# Patient Record
Sex: Male | Born: 1975 | Race: White | Hispanic: No | Marital: Married | Smoking: Former smoker
Health system: Southern US, Community
[De-identification: ages and names within clinical notes are randomized; demographics above are authoritative.]

## PROBLEM LIST (undated history)

## (undated) DIAGNOSIS — J309 Allergic rhinitis, unspecified: Secondary | ICD-10-CM

## (undated) DIAGNOSIS — E785 Hyperlipidemia, unspecified: Secondary | ICD-10-CM

## (undated) HISTORY — PX: APPENDECTOMY: SHX54

## (undated) HISTORY — DX: Hyperlipidemia, unspecified: E78.5

## (undated) HISTORY — DX: Allergic rhinitis, unspecified: J30.9

---

## 2008-01-02 ENCOUNTER — Ambulatory Visit: Payer: Self-pay | Admitting: Internal Medicine

## 2010-06-04 ENCOUNTER — Ambulatory Visit: Payer: Self-pay | Admitting: Internal Medicine

## 2010-06-04 DIAGNOSIS — J069 Acute upper respiratory infection, unspecified: Secondary | ICD-10-CM | POA: Insufficient documentation

## 2010-06-17 ENCOUNTER — Telehealth: Payer: Self-pay | Admitting: Internal Medicine

## 2010-10-06 NOTE — Assessment & Plan Note (Signed)
Summary: COUGH, CONGESTION // RS   Vital Signs:  Chan profile:   35 year old male Weight:      186 pounds Temp:     98.3 degrees F oral BP sitting:   110 / 72  (right arm) Cuff size:   regular  Vitals Entered By: Duard Brady LPN (June 04, 2010 4:06 PM) dCC: c/o cough, congestion , otc not heling  Is Chan Diabetic? No   CC:  c/o cough, congestion , and otc not heling .  History of Present Illness: Jason Chan who presents with a one-week history of cough and congestion.  He has had no fever, chills, chest pain or shortness of breath.  Cough is minimally productive.  He has a daughter and wife, who have had a similar acute illness.  He has a daughter in daycare, who first became ill.  Allergies (verified): No Known Drug Allergies he Review of Systems       The Chan complains of anorexia and prolonged cough.  The Chan denies fever, weight loss, weight gain, vision loss, decreased hearing, hoarseness, chest pain, syncope, dyspnea on exertion, peripheral edema, headaches, hemoptysis, abdominal pain, melena, hematochezia, severe indigestion/heartburn, hematuria, incontinence, genital sores, muscle weakness, suspicious skin lesions, transient blindness, difficulty walking, depression, unusual weight change, abnormal bleeding, enlarged lymph nodes, angioedema, breast masses, and testicular masses.    Physical Exam  General:  Well-developed,well-nourished,in no acute distress; alert,appropriate and cooperative throughout examination Head:  Normocephalic and atraumatic without obvious abnormalities. No apparent alopecia or balding. Eyes:  No corneal or conjunctival inflammation noted. EOMI. Perrla. Funduscopic exam benign, without hemorrhages, exudates or papilledema. Vision grossly normal. Ears:  External ear exam shows no significant lesions or deformities.  Otoscopic examination reveals clear canals, tympanic membranes are intact bilaterally without bulging,  retraction, inflammation or discharge. Hearing is grossly normal bilaterally. Nose:  External nasal examination shows no deformity or inflammation. Nasal mucosa are pink and moist without lesions or exudates. Mouth:  pharyngeal erythema.  pharyngeal erythema.   Neck:  No deformities, masses, or tenderness noted. Lungs:  Normal respiratory effort, chest expands symmetrically. Lungs are clear to auscultation, no crackles or wheezes. Heart:  Normal rate and regular rhythm. S1 and S2 normal without gallop, murmur, click, rub or other extra sounds.   Impression & Recommendations:  Problem # 1:  URI (ICD-465.9)  His updated medication list for this problem includes:    Hydrocodone-homatropine 5-1.5 Mg/25ml Syrp (Hydrocodone-homatropine) .Marland Kitchen... 1 teaspoon every 6 hours as needed for cough  His updated medication list for this problem includes:    Hydrocodone-homatropine 5-1.5 Mg/56ml Syrp (Hydrocodone-homatropine) .Marland Kitchen... 1 teaspoon every 6 hours as needed for cough  Complete Medication List: 1)  Hydrocodone-homatropine 5-1.5 Mg/2ml Syrp (Hydrocodone-homatropine) .Marland Kitchen.. 1 teaspoon every 6 hours as needed for cough  Chan Instructions: 1)  Get plenty of rest, drink lots of clear liquids, and use Tylenol or Ibuprofen for fever and comfort. Return in 7-10 days if you're not better:sooner if you're feeling worse. 2)  VIMOVO ONE TWICE DAILY 3)  CLARINEX ONE DAILY  Prescriptions: HYDROCODONE-HOMATROPINE 5-1.5 MG/5ML SYRP (HYDROCODONE-HOMATROPINE) 1 teaspoon every 6 hours as needed for cough  #6 OZ x 0   Entered and Authorized by:   Gordy Savers  MD   Signed by:   Gordy Savers  MD on 06/04/2010   Method used:   Print then Give to Chan   RxID:   [id number]

## 2010-10-06 NOTE — Progress Notes (Signed)
Summary: new RX  Phone Note Call from Patient   Caller: Patient Call For: Gordy Savers  MD Summary of Call: Pt is still coughing and congested and wants new RX.   Samples did not work from the office.   [phone] [facility] [address]) Initial call taken by: Lynann Beaver CMA,  June 17, 2010 11:42 AM  Follow-up for Phone Call        Tussionex (, generic, if available) 4 oz 1 teaspoon every  12 hours as needed for cough Follow-up by: Gordy Savers  MD,  June 17, 2010 12:43 PM    New/Updated Medications: Sandria Senter ER 10-8 MG/5ML LQCR (HYDROCOD POLST-CHLORPHEN POLST) as directed Prescriptions: Sandria Senter ER 10-8 MG/5ML LQCR (HYDROCOD POLST-CHLORPHEN POLST) as directed  #4 oz x 0   Entered by:   Lynann Beaver CMA   Authorized by:   Gordy Savers  MD   Signed by:   Lynann Beaver CMA on 06/17/2010   Method used:   Telephoned to ...       [facility] [address] (retail)       [address]       [place], [place]  [address]       Ph: [phone] or [phone]       Fax: [phone]   RxID:   [id number]

## 2014-11-18 ENCOUNTER — Telehealth: Payer: Self-pay | Admitting: Internal Medicine

## 2014-11-18 NOTE — Telephone Encounter (Signed)
Okay, only if necessary.  Suggest referral to another provider who is accepting new patients

## 2014-11-18 NOTE — Telephone Encounter (Signed)
Pt would like to know if you will accept him back as a pt. Pt not seen in many yrs. Pt had blood in his stool and wants to be seen soon for that.

## 2014-11-19 NOTE — Telephone Encounter (Signed)
Scheduled pt for new acute w/ dr hunter and then new pt visit to re-est w/ him.  Thank you dr Kirtland Bouchardk!!

## 2014-11-20 ENCOUNTER — Encounter: Payer: Self-pay | Admitting: Family Medicine

## 2014-11-20 ENCOUNTER — Ambulatory Visit (INDEPENDENT_AMBULATORY_CARE_PROVIDER_SITE_OTHER): Payer: 59 | Admitting: Family Medicine

## 2014-11-20 VITALS — BP 120/78 | HR 59 | Temp 98.1°F | Wt 189.0 lb

## 2014-11-20 DIAGNOSIS — E785 Hyperlipidemia, unspecified: Secondary | ICD-10-CM | POA: Insufficient documentation

## 2014-11-20 DIAGNOSIS — K625 Hemorrhage of anus and rectum: Secondary | ICD-10-CM

## 2014-11-20 DIAGNOSIS — K921 Melena: Secondary | ICD-10-CM

## 2014-11-20 LAB — LIPID PANEL
CHOL/HDL RATIO: 4
Cholesterol: 258 mg/dL — ABNORMAL HIGH (ref 0–200)
HDL: 65.2 mg/dL (ref >39.00)
LDL CALC: 177 mg/dL — AB (ref 0–99)
NonHDL: 192.8
TRIGLYCERIDES: 77 mg/dL (ref 0.0–149.0)
VLDL: 15.4 mg/dL (ref 0.0–40.0)

## 2014-11-20 LAB — COMPREHENSIVE METABOLIC PANEL
ALK PHOS: 69 U/L (ref 39–117)
ALT: 11 U/L (ref 0–53)
AST: 18 U/L (ref 0–37)
Albumin: 4.9 g/dL (ref 3.5–5.2)
BUN: 17 mg/dL (ref 6–23)
CO2: 30 mEq/L (ref 19–32)
Calcium: 9.8 mg/dL (ref 8.4–10.5)
Chloride: 100 mEq/L (ref 96–112)
Creatinine, Ser: 1.18 mg/dL (ref 0.40–1.50)
GFR: 72.32 mL/min (ref >60.00)
Glucose, Bld: 93 mg/dL (ref 70–99)
Potassium: 5.2 mEq/L — ABNORMAL HIGH (ref 3.5–5.1)
SODIUM: 138 meq/L (ref 135–145)
Total Bilirubin: 0.7 mg/dL (ref 0.2–1.2)
Total Protein: 7.9 g/dL (ref 6.0–8.3)

## 2014-11-20 LAB — CBC
HCT: 45.9 % (ref 39.0–52.0)
Hemoglobin: 15.9 g/dL (ref 13.0–17.0)
MCHC: 34.7 g/dL (ref 30.0–36.0)
MCV: 92.5 fl (ref 78.0–100.0)
Platelets: 277 10*3/uL (ref 150.0–400.0)
RBC: 4.96 Mil/uL (ref 4.22–5.81)
RDW: 13.2 % (ref 11.5–15.5)
WBC: 5.4 10*3/uL (ref 4.0–10.5)

## 2014-11-20 LAB — TSH: TSH: 1.17 u[IU]/mL (ref 0.35–4.50)

## 2014-11-20 NOTE — Patient Instructions (Addendum)
No clear blood on your hemoccult test today.   We are going to send you home with stool cards and if you see any of this redness again-want you to check that area for blood and bring the sample back into our office.   Also update fasting labs today. If you are anemic, I will likely go ahead and refer for colonoscopy.   If you have true blood on one of these tests i will also send you for colonoscopy  We can always go ahead and proceed forward with colonoscopy if you prefer. There is some risk for colon polyps that we cannot rule out with these stool cards alone.   If you don't have one of these bowel movements within 3 months, go ahead and complete the stool cards as this is reasonable screening for colon cancer

## 2014-11-20 NOTE — Assessment & Plan Note (Signed)
Check lipids. Would likely use 5% cut off for 10 year risk for statin given strong family history. Hold on aspirin for now-perhaps start age 39?

## 2014-11-20 NOTE — Progress Notes (Signed)
Jason ConchStephen Wilson Sample, MD Phone: [phone]  Subjective:  Patient presents today to establish care. Was seen here over 5 years ago but most recently cared for by NP at work through [employer]. Chief complaint-noted.   Blood in Stool ? -after eating a lot of beets. Wife thought it was blood about a week ago. 2 episodes of bright red material in toilet. It was not clear if this was material from the beets or actual bloodNo blood on toilet tissue. History of hemorrhoids (internal) noted on colonosocopy. Occasionally has had blood with wiping but never blood in the toilet mixed in with stoolso this was different and alarmed him. Has had the intermittent bleeds since around age 39 with wiping. Colonoscopy at age 39. Colonscopy was normal at that time except a hemorrhoid and no follow up since that time.   Maternal grandfather with colon cancer died in his 4150s.   ROS- denies melena, fatigues, shortness of breath, chest pain  Hyperlipidemia-reported from history (family history MI father age 41)  On statin: no Regular exercise: no, advised Diet: reasonable but knows he could watch portions better ROS- no chest pain or shortness of breath. No myalgias  The following were reviewed and entered/updated in epic: Past Medical History  Diagnosis Date  . Allergic rhinitis     otc claritin  . Hyperlipidemia     no rx   Patient Active Problem List   Diagnosis Date Noted  . Blood in stool 11/20/2014    Priority: Medium  . Hyperlipidemia 11/20/2014    Priority: Medium   Past Surgical History  Procedure Laterality Date  . Appendectomy      around 1990    Family History  Problem Relation Age of Onset  . Arthritis Mother   . Breast cancer Mother   . Prostate cancer Father     age 10060  . Hyperlipidemia Mother   . Hyperlipidemia Father   . Hypertension Father   . Heart attack Father     age 41    Medications- reviewed and updated. None.   Allergies-reviewed and updated No Known  Allergies  History   Social History  . Marital Status: Married    Spouse Name: N/A  . Number of Children: N/A  . Years of Education: N/A   Social History Main Topics  . Smoking status: Never Smoker   . Smokeless tobacco: Not on file     Comment: uses vaprorizer with nicotine intermittently  . Alcohol Use: 3.0 oz/week    5 Standard drinks or equivalent per week  . Drug Use: No  . Sexual Activity: Not on file   Other Topics Concern  . Not on file   Social History Narrative   Married. 3 kids. (4-9 in 2016)      Claims adjustor      Hobbies: golf, time with kids, xbox    ROS--See HPI , otherwise full ROS was completed and negative except as noted above  Objective: BP 120/78 mmHg  Pulse 59  Temp(Src) 98.1 F (36.7 C)  Wt 189 lb (85.73 kg) Gen: NAD, resting comfortably HEENT: Mucous membranes are moist. Oropharynx normal. TM normal. Eyes: sclera and lids normal, PERRLA Neck: no thyromegaly, no cervical lymphadenopathy CV: RRR no murmurs rubs or gallops Lungs: CTAB no crackles, wheeze, rhonchi Abdomen: soft/nontender/nondistended/normal bowel sounds. No rebound or guarding.  Rectal: normal prostate size without any nodules or asymmetry. Hemocult negative.  Ext: no edema, 2+ PT pulses Skin: warm, dry, no rash Neuro: 5/5 strength in  upper and lower extremities, normal gait, normal reflexes   Anoscope deferred  Assessment/Plan:  Blood in stool History of internal hemorrhoids on colonoscopy around age 2. Family history of colon cancer in maternal grandfather who died in his 72s. No polyps at age 71. Current "bleeding" is not clear if it was true blood or related to beets he was eating. Hemocult was negative in office. I gave him home stool cards if he sees this red material again and he is to collect samples even if does not see red material again (and bring in about 3 months from now). If follow up heme + or if hgb low today, would refer for colonoscopy. Otherwise, may  wait until 38 but consider stool cards yearly until that time.    Hyperlipidemia Check lipids. Would likely use 5% cut off for 10 year risk for statin given strong family history. Hold on aspirin for now-perhaps start age 64?   Return precautions advised. Patient may cancel June appointment but needs to bring stool cards regardless in 3 months.   Orders Placed This Encounter  Procedures  . CBC    [place]  . Comprehensive metabolic panel    [place]    Order Specific Question:  Has the patient fasted?    Answer:  No  . Lipid panel    [place]    Order Specific Question:  Has the patient fasted?    Answer:  No  . TSH    [place]

## 2014-11-20 NOTE — Assessment & Plan Note (Signed)
History of internal hemorrhoids on colonoscopy around age 39. Family history of colon cancer in maternal grandfather who died in his 5350s. No polyps at age 39. Current "bleeding" is not clear if it was true blood or related to beets he was eating. Hemocult was negative in office. I gave him home stool cards if he sees this red material again and he is to collect samples even if does not see red material again (and bring in about 3 months from now). If follow up heme + or if hgb low today, would refer for colonoscopy. Otherwise, may wait until 38 but consider stool cards yearly until that time.

## 2014-11-22 ENCOUNTER — Telehealth: Payer: Self-pay | Admitting: Family Medicine

## 2014-11-22 MED ORDER — ATORVASTATIN CALCIUM 20 MG PO TABS: 20.0000 mg | ORAL_TABLET | Freq: Every day | ORAL | Status: DC

## 2014-11-22 NOTE — Telephone Encounter (Addendum)
Pt was seen on 3-16 and now would like to get on medication the md discuss with him ?[facility]. Please call pt once rx has been sent

## 2014-11-22 NOTE — Telephone Encounter (Signed)
Pt.notified

## 2015-01-09 ENCOUNTER — Telehealth: Payer: Self-pay | Admitting: Family Medicine

## 2015-01-09 NOTE — Telephone Encounter (Signed)
noted 

## 2015-01-09 NOTE — Telephone Encounter (Signed)
Patient Name: Jason BabinskiUL Bisher DOB: 1976/08/20 Initial Comment Caller states RX meds generic for lipitor; getting low BS couple of times per day; getting the shakes; thinks could be from med; never been dx as diabetic; had them from time to time but not as frequently; NO SX NOW. Nurse Assessment Nurse: Yetta BarreJones, RN, Miranda Date/Time (Eastern Time): 01/09/2015 11:08:43 AM Confirm and document reason for call. If symptomatic, describe symptoms. ---Caller states he has episodes of feeling shaky and tired a couple of times a day for the last week. He started on Lipitor 1-2 months ago and wondering if this could be related. Has the patient traveled out of the country within the last 30 days? ---Not Applicable Does the patient require triage? ---Yes Related visit to physician within the last 2 weeks? ---No Does the PT have any chronic conditions? (i.e. diabetes, asthma, etc.) ---Yes List chronic conditions. ---High Cholesterol Guidelines Guideline Title Affirmed Question Affirmed Notes Weakness (Generalized) and Fatigue Taking a medicine that could cause weakness (e.g., blood pressure medications, diuretics) Final Disposition User See Physician within 24 Hours Yetta BarreJones, RN, Miranda Comments Appt scheduled for 4pm tomorrow with Dr. Selena BattenKim.

## 2015-01-09 NOTE — Telephone Encounter (Signed)
Pt is having side effect from generic lipitor. Pt feels like he is getting low blood sugar at least twice a day. Pt call transfer to triage

## 2015-01-10 ENCOUNTER — Encounter: Payer: Self-pay | Admitting: Family Medicine

## 2015-01-10 ENCOUNTER — Ambulatory Visit (INDEPENDENT_AMBULATORY_CARE_PROVIDER_SITE_OTHER): Payer: 59 | Admitting: Family Medicine

## 2015-01-10 VITALS — BP 102/60 | HR 75 | Temp 98.1°F | Ht 71.0 in | Wt 185.2 lb

## 2015-01-10 DIAGNOSIS — R5383 Other fatigue: Secondary | ICD-10-CM

## 2015-01-10 DIAGNOSIS — T50905A Adverse effect of unspecified drugs, medicaments and biological substances, initial encounter: Secondary | ICD-10-CM

## 2015-01-10 DIAGNOSIS — T887XXA Unspecified adverse effect of drug or medicament, initial encounter: Secondary | ICD-10-CM | POA: Diagnosis not present

## 2015-01-10 LAB — HEMOGLOBIN A1C
Hgb A1c MFr Bld: 5.6 % (ref <5.7)
Mean Plasma Glucose: 114 mg/dL (ref <117)

## 2015-01-10 NOTE — Addendum Note (Signed)
Addended by: Alfred LevinsWYRICK, CINDY D on: 01/10/2015 04:18 PM   Modules accepted: Orders

## 2015-01-10 NOTE — Progress Notes (Signed)
Pre visit review using our clinic review tool, if applicable. No additional management support is needed unless otherwise documented below in the visit note. 

## 2015-01-10 NOTE — Progress Notes (Signed)
HPI:  Question medication reaction: -started statin 1-2 months ago -hungry/mildly fatigued feeling lasting about 5 minutes has occurred several times this week a few hours after a meal - feels fine once eats something -he reports sometimes has this anyways his whole life but only occurs once per week  -reports someone at work told him this is a side effect to the medication he is taking and can be diabetes -no symptoms today -denies: fevers, chills, neuropathy, CP, SOB, weakness or fatigue other times, leg cramps, polyuria or polydipsia -he is intentionally trying to loose weight  ROS: See pertinent positives and negatives per HPI.  Past Medical History  Diagnosis Date  . Allergic rhinitis     otc claritin  . Hyperlipidemia     no rx    Past Surgical History  Procedure Laterality Date  . Appendectomy      around 1990    Family History  Problem Relation Age of Onset  . Arthritis Mother   . Breast cancer Mother   . Prostate cancer Father     age 41  . Hyperlipidemia Mother   . Hyperlipidemia Father   . Hypertension Father   . Heart attack Father     age 41    History   Social History  . Marital Status: Married    Spouse Name: N/A  . Number of Children: N/A  . Years of Education: N/A   Social History Main Topics  . Smoking status: Never Smoker   . Smokeless tobacco: Not on file     Comment: uses vaprorizer with nicotine intermittently  . Alcohol Use: 3.0 oz/week    5 Standard drinks or equivalent per week  . Drug Use: No  . Sexual Activity: Not on file   Other Topics Concern  . None   Social History Narrative   Married. 3 kids. (4-9 in 2016)      Claims adjustor      Hobbies: golf, time with kids, xbox     Current outpatient prescriptions:  .  atorvastatin (LIPITOR) 20 MG tablet, Take 1 tablet (20 mg total) by mouth daily., Disp: 30 tablet, Rfl: 11 .  fluticasone (FLONASE) 50 MCG/ACT nasal spray, Place into both nostrils daily., Disp: , Rfl:    EXAM:  Filed Vitals:   01/10/15 1543  BP: 102/60  Pulse: 75  Temp: 98.1 F (36.7 C)    Body mass index is 25.84 kg/(m^2).  GENERAL: vitals reviewed and listed above, alert, oriented, appears well hydrated and in no acute distress  HEENT: atraumatic, conjunttiva clear, no obvious abnormalities on inspection of external nose and ears  NECK: no obvious masses on inspection  LUNGS: clear to auscultation bilaterally, no wheezes, rales or rhonchi, good air movement  CV: HRRR, no peripheral edema  MS: moves all extremities without noticeable abnormality  PSYCH: pleasant and cooperative, no obvious depression or anxiety  ASSESSMENT AND PLAN:  Discussed the following assessment and plan:  Medication side effect, initial encounter  Other fatigue - Plan: Hemoglobin A1c  -his symptoms seem quite mild and has not had any today -no alarm symptoms -he is worried about a side effect to the statin - we discussed stopping, changing, risks/benefits -he opted to check hgba1c to see if prediabetic, cont statin for now and follow up with PCP in 4 weeks to discuss further if symptoms persist -Patient advised to return or notify a doctor immediately if symptoms worsen or persist or new concerns arise.  Patient Instructions  BEFORE YOU  LEAVE: -hgba1c -follow up in 4 weeks (sooner if needed)     Omolara Carol, Dahlia ClientHANNAH R.

## 2015-01-10 NOTE — Patient Instructions (Signed)
BEFORE YOU LEAVE: -hgba1c -follow up in 4 weeks (sooner if needed)

## 2015-02-06 ENCOUNTER — Ambulatory Visit: Payer: Self-pay | Admitting: Family Medicine

## 2015-10-21 ENCOUNTER — Other Ambulatory Visit: Payer: Self-pay | Admitting: Family Medicine

## 2015-11-06 ENCOUNTER — Telehealth: Payer: Self-pay | Admitting: Family Medicine

## 2015-11-06 NOTE — Telephone Encounter (Signed)
You can schedule Direct LDL, total cholesterol, triglycerides and CMP under hyperlipidemia. Tell him last full lipid panel was 11/20/14 so insurance will not pay for full panel

## 2015-11-06 NOTE — Telephone Encounter (Signed)
Pt states he has seen you before, last June for an acute visit. Pt has been on atorvastatin (LIPITOR) 20 MG tablet  since that visit and was hoping to get his cholesterol checked prior to his visit   with you on Monday, March 6.  However, the was put in as a new pt visit. Is it ok to schedule   some labs before then? Pt states he has not had it checked since he saw you last March 2016

## 2015-11-06 NOTE — Telephone Encounter (Signed)
Do want any other labs along with lipid?

## 2015-11-07 ENCOUNTER — Other Ambulatory Visit: Payer: Self-pay | Admitting: Family Medicine

## 2015-11-07 DIAGNOSIS — E785 Hyperlipidemia, unspecified: Secondary | ICD-10-CM

## 2015-11-07 NOTE — Telephone Encounter (Signed)
Labs ordered, please schedule pt for lab visit.

## 2015-11-07 NOTE — Telephone Encounter (Signed)
Pt has been scheduled.  °

## 2015-11-10 ENCOUNTER — Ambulatory Visit: Payer: 59 | Admitting: Family Medicine

## 2015-11-20 ENCOUNTER — Other Ambulatory Visit: Payer: Self-pay | Admitting: Family Medicine

## 2015-11-21 ENCOUNTER — Ambulatory Visit (INDEPENDENT_AMBULATORY_CARE_PROVIDER_SITE_OTHER): Payer: 59 | Admitting: Family Medicine

## 2015-11-21 ENCOUNTER — Encounter: Payer: Self-pay | Admitting: Family Medicine

## 2015-11-21 VITALS — BP 118/80 | HR 70 | Temp 98.2°F | Resp 20 | Ht 71.0 in | Wt 192.0 lb

## 2015-11-21 DIAGNOSIS — Z23 Encounter for immunization: Secondary | ICD-10-CM

## 2015-11-21 DIAGNOSIS — K921 Melena: Secondary | ICD-10-CM

## 2015-11-21 DIAGNOSIS — E785 Hyperlipidemia, unspecified: Secondary | ICD-10-CM | POA: Diagnosis not present

## 2015-11-21 DIAGNOSIS — Z0000 Encounter for general adult medical examination without abnormal findings: Secondary | ICD-10-CM

## 2015-11-21 LAB — LIPID PANEL
CHOLESTEROL: 170 mg/dL (ref 0–200)
HDL: 66.2 mg/dL (ref >39.00)
LDL Cholesterol: 90 mg/dL (ref 0–99)
NonHDL: 103.65
TRIGLYCERIDES: 69 mg/dL (ref 0.0–149.0)
Total CHOL/HDL Ratio: 3
VLDL: 13.8 mg/dL (ref 0.0–40.0)

## 2015-11-21 LAB — POC URINALSYSI DIPSTICK (AUTOMATED)
Bilirubin, UA: NEGATIVE
Blood, UA: NEGATIVE
GLUCOSE UA: NEGATIVE
Ketones, UA: NEGATIVE
Leukocytes, UA: NEGATIVE
NITRITE UA: NEGATIVE
PROTEIN UA: NEGATIVE
Spec Grav, UA: 1.025
UROBILINOGEN UA: 0.2
pH, UA: 6

## 2015-11-21 LAB — COMPREHENSIVE METABOLIC PANEL
ALBUMIN: 4.6 g/dL (ref 3.5–5.2)
ALT: 17 U/L (ref 0–53)
AST: 16 U/L (ref 0–37)
Alkaline Phosphatase: 63 U/L (ref 39–117)
BILIRUBIN TOTAL: 0.9 mg/dL (ref 0.2–1.2)
BUN: 22 mg/dL (ref 6–23)
CALCIUM: 9.6 mg/dL (ref 8.4–10.5)
CO2: 31 mEq/L (ref 19–32)
CREATININE: 1.03 mg/dL (ref 0.40–1.50)
Chloride: 100 mEq/L (ref 96–112)
GFR: 84.19 mL/min (ref >60.00)
Glucose, Bld: 87 mg/dL (ref 70–99)
Potassium: 3.9 mEq/L (ref 3.5–5.1)
Sodium: 139 mEq/L (ref 135–145)
TOTAL PROTEIN: 7.6 g/dL (ref 6.0–8.3)

## 2015-11-21 LAB — CBC WITH DIFFERENTIAL/PLATELET
BASOS ABS: 0 10*3/uL (ref 0.0–0.1)
BASOS PCT: 0.6 % (ref 0.0–3.0)
EOS ABS: 0.2 10*3/uL (ref 0.0–0.7)
Eosinophils Relative: 3.7 % (ref 0.0–5.0)
HEMATOCRIT: 45 % (ref 39.0–52.0)
HEMOGLOBIN: 15.6 g/dL (ref 13.0–17.0)
Lymphocytes Relative: 34.7 % (ref 12.0–46.0)
Lymphs Abs: 2.1 10*3/uL (ref 0.7–4.0)
MCHC: 34.7 g/dL (ref 30.0–36.0)
MCV: 92.2 fl (ref 78.0–100.0)
Monocytes Absolute: 0.4 10*3/uL (ref 0.1–1.0)
Monocytes Relative: 7.1 % (ref 3.0–12.0)
Neutro Abs: 3.2 10*3/uL (ref 1.4–7.7)
Neutrophils Relative %: 53.9 % (ref 43.0–77.0)
Platelets: 265 10*3/uL (ref 150.0–400.0)
RBC: 4.88 Mil/uL (ref 4.22–5.81)
RDW: 13.2 % (ref 11.5–15.5)
WBC: 5.9 10*3/uL (ref 4.0–10.5)

## 2015-11-21 NOTE — Addendum Note (Signed)
Addended by: Jimmye NormanPHANOS, Loyed Wilmes J on: 11/21/2015 10:34 AM   Modules accepted: Orders

## 2015-11-21 NOTE — Assessment & Plan Note (Signed)
Was likely beets related- resolved without heavy beats. Family history in grandfather though so stool cards until age 42.

## 2015-11-21 NOTE — Progress Notes (Signed)
Pre visit review using our clinic review tool, if applicable. No additional management support is needed unless otherwise documented below in the visit note. 

## 2015-11-21 NOTE — Assessment & Plan Note (Signed)
Father MI in 2050s. Atorvastatin 20mg  since last ldl 177- recheck today. Consider aspirin age 40.

## 2015-11-21 NOTE — Progress Notes (Addendum)
Tana Conch, MD Phone: [phone]  Subjective:  Patient presents today for their annual physical. Chief complaint-noted.   See problem oriented charting- ROS- full  review of systems was completed and negative including in last year has not noted blood in stool as long as not eating beats  The following were reviewed and entered/updated in epic: Past Medical History  Diagnosis Date  . Allergic rhinitis     otc claritin  . Hyperlipidemia    Patient Active Problem List   Diagnosis Date Noted  . Blood in stool 11/20/2014    Priority: Medium  . Hyperlipidemia 11/20/2014    Priority: Medium   Past Surgical History  Procedure Laterality Date  . Appendectomy      around 1990    Family History  Problem Relation Age of Onset  . Arthritis Mother   . Breast cancer Mother   . Cancer Father     age 36 ? kind- checking if prostate cancer  . Hyperlipidemia Father   . Hypertension Father   . Heart attack Father     age 3    Medications- reviewed and updated Current Outpatient Prescriptions  Medication Sig Dispense Refill  . atorvastatin (LIPITOR) 20 MG tablet TAKE 1 TABLET(20 MG) BY MOUTH DAILY 90 tablet 2   No current facility-administered medications for this visit.    Allergies-reviewed and updated No Known Allergies  Social History   Social History  . Marital Status: Married    Spouse Name: N/A  . Number of Children: N/A  . Years of Education: N/A   Social History Main Topics  . Smoking status: Never Smoker   . Smokeless tobacco: None     Comment: uses vaprorizer with nicotine intermittently  . Alcohol Use: 3.0 oz/week    5 Standard drinks or equivalent per week  . Drug Use: No  . Sexual Activity: Not Asked   Other Topics Concern  . None   Social History Narrative   Married. 3 kids. (5-10 in 2017)      Claims adjustor      Hobbies: golf, time with kids, xbox    ROS--See HPI   Objective: BP 118/80 mmHg  Pulse 70  Temp(Src) 98.2 F (36.8  C) (Oral)  Resp 20  Ht  (1.803 m)  Wt 192 lb (87.091 kg)  BMI 26.79 kg/m2  SpO2 98% Gen: NAD, resting comfortably HEENT: Mucous membranes are moist. Oropharynx normal Neck: no thyromegaly CV: RRR no murmurs rubs or gallops Lungs: CTAB no crackles, wheeze, rhonchi Abdomen: soft/nontender/nondistended/normal bowel sounds. No rebound or guarding.  Ext: no edema Skin: warm, dry Neuro: grossly normal, moves all extremities, PERRLA  Assessment/Plan:  40 y.o. male presenting for annual physical.  Health Maintenance counseling: 1. Anticipatory guidance: Patient counseled regarding regular dental exams, eye exams (reading glasses), wearing seatbelts.  2. Risk factor reduction:  Advised patient of need for regular exercise (lacking right now) and diet rich and fruits and vegetables to reduce risk of heart attack and stroke.  Wt Readings from Last 3 Encounters:  11/21/15 192 lb (87.091 kg)  01/10/15 185 lb 3.2 oz (84.006 kg)  11/20/14 189 lb (85.73 kg)  3. Immunizations/screenings/ancillary studies Health Maintenance Due  Topic Date Due  . TETANUS/TDAP - today 12/02/1992  . INFLUENZA VACCINE - [facility]/07/2015   4. Prostate cancer screening- patient to check- he is not sure if it was really prostate cancer in father at age 42- we will decide next year whether  to screen early  5. Colon cancer screening - will do stool cards until age 40  Hyperlipidemia- compliant with atorvastatin, update lipids  Return in about 1 year (around 11/20/2016) for physical. Return precautions advised.   Orders Placed This Encounter  Procedures  . CBC with Differential/Platelet  . Comprehensive metabolic panel    [place]    Order Specific Question:  Has the patient fasted?    Answer:  No  . Lipid panel    [place]    Order Specific Question:  Has the patient fasted?    Answer:  No  . POCT Urinalysis Dipstick (Automated)  . POC Hemoccult Bld/Stl (3-Cd Home Screen)     Send home    Standing Status: Future     Number of Occurrences:      Standing Expiration Date: 11/20/2016   Lupita LeashDonna to enter Tdap, patient had flu as well to be abstracted

## 2015-11-21 NOTE — Patient Instructions (Addendum)
Lupita LeashDonna will update flu shot from November 2016  Tdap today  Labs before you go. Try to watch the weight creep (talk to your wife!) Wt Readings from Last 3 Encounters:  11/21/15 192 lb (87.091 kg)  01/10/15 185 lb 3.2 oz (84.006 kg)  11/20/14 189 lb (85.73 kg)   Consider aspirin 81mg 

## 2016-05-12 ENCOUNTER — Other Ambulatory Visit: Payer: Self-pay | Admitting: Family Medicine

## 2016-05-12 NOTE — Telephone Encounter (Signed)
Rx refill sent to pharmacy. 

## 2016-05-28 ENCOUNTER — Encounter: Payer: Self-pay | Admitting: Internal Medicine

## 2016-05-28 ENCOUNTER — Telehealth: Payer: Self-pay | Admitting: Internal Medicine

## 2016-05-28 MED ORDER — MEBENDAZOLE 100 MG PO CHEW: CHEWABLE_TABLET | ORAL | 0 refills | Status: DC

## 2016-05-28 NOTE — Telephone Encounter (Signed)
Sent in mebendazole. Discussed with patient.

## 2016-05-28 NOTE — Telephone Encounter (Signed)
Comments  User: Oralia ManisMelissa, Mullins, RN Date/Time (Eastern Time): 05/28/2016 4:32:09 PM  Caller reports had a previous treatment OTC for herself and daughter but now has seen live pinworms in all stools for three children, spouse and herself. Requesting a prescription be called into the pharmacy. Caller was placed on hold to consult with the office backline and the office person reports will not call in anything without first being seen, offered to schedule appt with the [facility] office for tomorrow morning, caller reports she will call the pediatrician office back and see if they can call in RX for her and husband, since they are calling in RX for three children. caller reports will call back if still needs to be seen.   Referrals  [facility]

## 2016-05-28 NOTE — Telephone Encounter (Signed)
Jason LawlessCeleste Chan [birth year] Pt of dr. Fabian SharpPanosh. [facility] [place]

## 2016-05-28 NOTE — Telephone Encounter (Signed)
[facility] - Client  TELEPHONE ADVICE RECORD   [facility]     Patient Name: Jason BabinskiPAUL Chan Client [facility] - Client    Client Site [facility]    Physician AA - PHYSICIAN, UNKNOWN- MD    Contact Type Call    Who Is Calling Patient / Member / Family / Caregiver    Call Type Triage / Clinical    Caller Name Sheran LawlessCeleste Baumgardner     Relationship To Patient Spouse    Return Phone Number [phone] (Primary)  Gender: Male Chief Complaint Worms  DOB: 06-08-1976  Reason for Call Symptomatic / Request for Health Information  Age: [age] Y 5 M 24 D Initial Comment CHART 2/2 Caller states herself and her husband may have worms in their bowel movements. She does not know husband's doctor's name.   Return Phone Number: [phone] (Primary) PreDisposition Call Doctor  Address: [address]  Translation No  City[place]State[place]: [place] [id number] No Triage Reason Other    Nurse Assessment  Nurse: Sabino GasserMullins, RN, Melissa Date/Time Lamount Cohen(Eastern Time): 05/28/2016 4:21:31 PM  Confirm and document reason for call. If symptomatic, describe symptoms. You must click the next button to save text entered. ---Caller states herself and her husband may have worms in their bowel movements. She does not know husband's doctor's name.  Has the patient traveled out of the country within the last 30 days? ---Not Applicable  Does the patient have any new or worsening symptoms? ---Yes  Will a triage be completed? ---Yes  Related visit to physician within the last 2 weeks? ---No  Does the PT have any chronic conditions? (i.e. diabetes, asthma, etc.) ---No  Is this a behavioral health or substance abuse call? ---No    Guidelines      Guideline Title Affirmed Question Affirmed Notes Nurse Date/Time Lamount Cohen(Eastern Time)  Rectal Symptoms MODERATE-SEVERE rectal itching (i.e., interferes with school, work, or sleep)  Sabino GasserMullins, RN, Efraim KaufmannMelissa  05/28/2016 4:22:08 PM  Disp. Time Lamount Cohen(Eastern Time) Disposition Final User         05/28/2016 4:22:40 PM See Physician within 24 Hours Yes Sabino GasserMullins, RN, Efraim KaufmannMelissa         Caller Understands: Yes   Disagree/Comply: Disagree   Disagree/Comply Reason: Disagree with instructions   Care Advice Given Per Guideline         SEE PHYSICIAN WITHIN 24 HOURS: CALL BACK IF:             Referrals   [facility]

## 2016-05-31 ENCOUNTER — Telehealth: Payer: Self-pay

## 2016-05-31 NOTE — Telephone Encounter (Signed)
PA Approved. Form faxed back to pharmacy.  

## 2016-05-31 NOTE — Telephone Encounter (Signed)
Received PA request from [facility] for Emverm 100mg  tablet. PA submitted & is pending. Key: [id number]

## 2016-12-01 ENCOUNTER — Other Ambulatory Visit: Payer: Self-pay | Admitting: Family Medicine

## 2017-03-17 DIAGNOSIS — J0100 Acute maxillary sinusitis, unspecified: Secondary | ICD-10-CM | POA: Diagnosis not present

## 2017-05-03 LAB — BASIC METABOLIC PANEL
BUN: 16 (ref 4–21)
Creatinine: 0.9 (ref 0.6–1.3)
Glucose: 96
Potassium: 4.5 (ref 3.4–5.3)
SODIUM: 136 — AB (ref 137–147)

## 2017-05-03 LAB — LIPID PANEL
CHOLESTEROL: 161 (ref 0–200)
HDL: 60 (ref 35–70)
LDL CALC: 84
TRIGLYCERIDES: 83 (ref 40–160)

## 2017-05-03 LAB — HEPATIC FUNCTION PANEL
ALK PHOS: 75 (ref 25–125)
ALT: 19 (ref 10–40)
AST: 20 (ref 14–40)
BILIRUBIN, TOTAL: 0.6

## 2017-05-03 LAB — CBC AND DIFFERENTIAL
HEMATOCRIT: 43 (ref 41–53)
HEMOGLOBIN: 14.9 (ref 13.5–17.5)
Platelets: 270 (ref 150–399)

## 2017-05-05 ENCOUNTER — Encounter: Payer: Self-pay | Admitting: Family Medicine

## 2017-05-28 ENCOUNTER — Other Ambulatory Visit: Payer: Self-pay | Admitting: Family Medicine

## 2017-05-30 ENCOUNTER — Other Ambulatory Visit: Payer: Self-pay | Admitting: Family Medicine

## 2017-07-13 DIAGNOSIS — H60391 Other infective otitis externa, right ear: Secondary | ICD-10-CM | POA: Diagnosis not present

## 2017-08-25 ENCOUNTER — Encounter: Payer: Self-pay | Admitting: Family Medicine

## 2017-08-25 ENCOUNTER — Other Ambulatory Visit: Payer: Self-pay | Admitting: Family Medicine

## 2017-10-18 DIAGNOSIS — E162 Hypoglycemia, unspecified: Secondary | ICD-10-CM | POA: Diagnosis not present

## 2017-10-21 DIAGNOSIS — E162 Hypoglycemia, unspecified: Secondary | ICD-10-CM | POA: Diagnosis not present

## 2017-12-05 ENCOUNTER — Encounter: Payer: 59 | Admitting: Family Medicine

## 2017-12-26 DIAGNOSIS — M79646 Pain in unspecified finger(s): Secondary | ICD-10-CM | POA: Diagnosis not present

## 2017-12-26 DIAGNOSIS — M79642 Pain in left hand: Secondary | ICD-10-CM | POA: Diagnosis not present

## 2018-02-06 ENCOUNTER — Encounter: Payer: Self-pay | Admitting: Family Medicine

## 2018-02-06 ENCOUNTER — Ambulatory Visit (INDEPENDENT_AMBULATORY_CARE_PROVIDER_SITE_OTHER): Payer: 59 | Admitting: Family Medicine

## 2018-02-06 VITALS — BP 98/66 | HR 74 | Temp 98.4°F | Ht 71.0 in | Wt 187.8 lb

## 2018-02-06 DIAGNOSIS — E785 Hyperlipidemia, unspecified: Secondary | ICD-10-CM | POA: Diagnosis not present

## 2018-02-06 DIAGNOSIS — Z1211 Encounter for screening for malignant neoplasm of colon: Secondary | ICD-10-CM | POA: Diagnosis not present

## 2018-02-06 DIAGNOSIS — Z87891 Personal history of nicotine dependence: Secondary | ICD-10-CM | POA: Diagnosis not present

## 2018-02-06 DIAGNOSIS — Z0000 Encounter for general adult medical examination without abnormal findings: Secondary | ICD-10-CM | POA: Diagnosis not present

## 2018-02-06 LAB — POC URINALSYSI DIPSTICK (AUTOMATED)
Bilirubin, UA: NEGATIVE
Glucose, UA: NEGATIVE
Leukocytes, UA: NEGATIVE
Nitrite, UA: NEGATIVE
PH UA: 5.5 (ref 5.0–8.0)
PROTEIN UA: NEGATIVE
RBC UA: NEGATIVE
SPEC GRAV UA: 1.02 (ref 1.010–1.025)
UROBILINOGEN UA: 0.2 U/dL

## 2018-02-06 MED ORDER — ATORVASTATIN CALCIUM 20 MG PO TABS: 20.0000 mg | ORAL_TABLET | Freq: Every day | ORAL | 3 refills | Status: DC

## 2018-02-06 NOTE — Progress Notes (Signed)
Phone: [phone]  Subjective:  Patient presents today for their annual physical. Chief complaint-noted.   See problem oriented charting- ROS- full  review of systems was completed and negative except for: back pain  The following were reviewed and entered/updated in epic: Past Medical History:  Diagnosis Date  . Allergic rhinitis    otc claritin  . Hyperlipidemia    Patient Active Problem List   Diagnosis Date Noted  . Blood in stool 11/20/2014    Priority: Medium  . Hyperlipidemia 11/20/2014    Priority: Medium   Past Surgical History:  Procedure Laterality Date  . APPENDECTOMY     around 1990    Family History  Problem Relation Age of Onset  . Arthritis Mother   . Breast cancer Mother   . Cancer Father        age 44 ? kind- checking if prostate cancer  . Hyperlipidemia Father   . Hypertension Father   . Heart attack Father        age 44    Medications- reviewed and updated Current Outpatient Medications  Medication Sig Dispense Refill  . atorvastatin (LIPITOR) 20 MG tablet Take 1 tablet (20 mg total) by mouth daily. 90 tablet 3   No current facility-administered medications for this visit.     Allergies-reviewed and updated No Known Allergies  Objective: BP 98/66 (BP Location: Left Arm, Patient Position: Sitting, Cuff Size: Large)   Pulse 74   Temp 98.4 F (36.9 C) (Oral)   Ht 5\' 11"  (1.803 m)   Wt 187 lb 12.8 oz (85.2 kg)   SpO2 98%   BMI 26.19 kg/m  Gen: NAD, resting comfortably HEENT: Mucous membranes are moist. Oropharynx normal Neck: no thyromegaly CV: RRR no murmurs rubs or gallops Lungs: CTAB no crackles, wheeze, rhonchi Abdomen: soft/nontender/nondistended/normal bowel sounds. No rebound or guarding.  Ext: no edema Skin: warm, dry Neuro: grossly normal, moves all extremities, PERRLA  Assessment/Plan:  42 y.o. male presenting for annual physical.  Health Maintenance counseling: 1. Anticipatory guidance: Patient counseled  regarding regular dental exams -q9 months, eye exams - started with readers- going every few years, wearing seatbelts.  2. Risk factor reduction:  Advised patient of need for regular exercise and diet rich and fruits and vegetables to reduce risk of heart attack and stroke. Exercise- he wants to be more consistent with exercise through year but has been working out well trying to prep for beach. . Diet-balanced diet. .  Wt Readings from Last 3 Encounters:  02/06/18 187 lb 12.8 oz (85.2 kg)  11/21/15 192 lb (87.1 kg)  01/10/15 185 lb 3.2 oz (84 kg)  3. Immunizations/screenings/ancillary studies- up to date Immunization History  Administered Date(s) Administered  . Influenza-Unspecified 06/20/2014, 07/24/2015, 06/10/2017  . Tdap 11/21/2015  4. Prostate cancer screening- no family history, start at age 42. Dad with bladder cancer history- was a smoker 5. Colon cancer screening - family history in grandfather- will get colonoscopy age 42, yearly stool cards until that time did have history of possible blood in stool with heavy beet consumption years ago, resolved with cutting back 6. Skin cancer screening- saw dermatology a few years ago- is planning to go back soon. advised regular sunscreen use. Denies worrisome, changing, or new skin lesions.  7. Former smoker- 11.2 pack years but quit when was 42 years old . AAA screen at 63 planned.   Status of chronic or acute concerns  Hyperlipidemia- reasonably controlled on atorvastatin 20 mg.  Last full panel in  August through work.   Can get some muscle spasms in low back- got some muscle relaxants through work from 2-3 years ago- had some severe ones a few years ago. Bad posture, weak core- he is working on that right now trying to strengthen core. Im willing to refill flexeril if needed within next 18 months.   1 year for CPE- 18 months at longest for refills  We will await blood work from his job.  Lab/Order associations: Preventative health care  - Plan: Fecal occult blood, imunochemical  Screen for colon cancer - Plan: Fecal occult blood, imunochemical  Former smoker - Plan: POCT Urinalysis Dipstick (Automated)  Meds ordered this encounter  Medications  . atorvastatin (LIPITOR) 20 MG tablet    Sig: Take 1 tablet (20 mg total) by mouth daily.    Dispense:  90 tablet    Refill:  3    Return precautions advised.  Tana Conch, MD

## 2018-02-06 NOTE — Patient Instructions (Addendum)
Please stop by lab before you go- urine and stool cards  Great job losing 5 lbs! Try to keep the exercise up after vacation- that would be awesome for your long term health  We will look out for labs from your job

## 2018-02-06 NOTE — Addendum Note (Signed)
Addended by: London SheerFRIZZELL, BAILEY T on: 02/06/2018 03:00 PM   Modules accepted: Orders

## 2018-02-15 LAB — HEPATIC FUNCTION PANEL
ALK PHOS: 74 (ref 25–125)
ALT: 16 (ref 10–40)
AST: 22 (ref 14–40)
BILIRUBIN DIRECT: 0.22 (ref 0.01–0.4)

## 2018-02-15 LAB — CBC AND DIFFERENTIAL
HCT: 46 (ref 41–53)
Hemoglobin: 15.6 (ref 13.5–17.5)
Platelets: 290 (ref 150–399)
WBC: 4.7

## 2018-02-15 LAB — BASIC METABOLIC PANEL
BUN: 16 (ref 4–21)
Creatinine: 1 (ref 0.6–1.3)
Potassium: 5.5 — AB (ref 3.4–5.3)
Sodium: 140 (ref 137–147)

## 2018-02-15 LAB — LIPID PANEL
Cholesterol: 183 (ref 0–200)
HDL: 70 (ref 35–70)
LDL Cholesterol: 97
Triglycerides: 79 (ref 40–160)

## 2018-03-01 ENCOUNTER — Encounter: Payer: Self-pay | Admitting: Family Medicine

## 2018-03-04 ENCOUNTER — Encounter: Payer: Self-pay | Admitting: Family Medicine

## 2018-03-23 ENCOUNTER — Telehealth: Payer: Self-pay

## 2018-03-23 NOTE — Telephone Encounter (Signed)
Called and left a voicemail message asking patient to return my call. He needs to return the stool cards provided.

## 2018-03-23 NOTE — Telephone Encounter (Signed)
-----   Message from Shelva MajesticStephen O Hunter, MD sent at 03/13/2018 12:54 PM EDT ----- Needs to complete his stool cards.   Tana ConchStephen Hunter  ----- Message ----- From: SYSTEM Sent: 03/13/2018  12:07 AM To: Shelva MajesticStephen O Hunter, MD

## 2018-04-04 ENCOUNTER — Encounter: Payer: Self-pay | Admitting: Family Medicine

## 2018-04-12 NOTE — Telephone Encounter (Signed)
CRM for notification. See Telephone encounter for: 04/12/18.  Pt called in to schedule an apt however pt says that he is not sure (understand) why. Pt says that he is only requesting to get a stand up desk at work, not time off. Pt says that all of his co-workers are getting them just because it is better for their back because of sitting all day. Pt doesn't feel that an ov should be needed. Pt says that if he has to come in for ov for something so simple (and pay co-pay) and he was just seen he is going to switch providers. Pt asked that I send a message to clarify to provider what is actually requesting, if an apt is still needed then pt says that he will go ahead and schedule.   CB: [phone]

## 2018-06-07 ENCOUNTER — Other Ambulatory Visit: Payer: Self-pay | Admitting: *Deleted

## 2018-06-07 MED ORDER — ATORVASTATIN CALCIUM 20 MG PO TABS: 20.0000 mg | ORAL_TABLET | Freq: Every day | ORAL | 3 refills | Status: AC

## 2018-11-02 ENCOUNTER — Telehealth: Payer: Self-pay | Admitting: Family Medicine

## 2018-11-02 NOTE — Telephone Encounter (Signed)
Pt. Requesting muscle relaxant; see progress note from 02/06/2018 per Dr. Durene Cal; he is willing to order Flexeril for muscle spasms, for up to 18 mos., from the June 2020 appt.

## 2018-11-02 NOTE — Telephone Encounter (Signed)
Copied from CRM [id number]. Topic: Quick Communication - See Telephone Encounter >> Nov 02, 2018  3:04 PM Trula Slade wrote: CRM for notification. See Telephone encounter for: 11/02/18. Patient stated that his provider put a note in his file that said if he needed a muscle relaxer for his back spasms  it would be okay to send a prescription for it to his preferred pharmacy [facility] in [place]. Patient is leaving out of town tomorrow.

## 2018-11-03 NOTE — Telephone Encounter (Signed)
See note

## 2018-11-03 NOTE — Telephone Encounter (Signed)
Okay for refill? Please advise 

## 2018-11-06 ENCOUNTER — Other Ambulatory Visit: Payer: Self-pay

## 2018-11-06 NOTE — Telephone Encounter (Signed)
What is the dosing direction Please advise

## 2018-11-06 NOTE — Telephone Encounter (Signed)
3 times daily PRN #30 with 5 refills

## 2018-11-06 NOTE — Telephone Encounter (Signed)
Yes thanks- may refill his flexeril

## 2018-11-08 NOTE — Telephone Encounter (Signed)
I was out sick 2 days last week and had a day off for family vacation and was not on my computer that day. Anytime I received messages I responded to them- Lea or [place] he may still want to leave but would you reach out to him. 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

## 2018-11-08 NOTE — Telephone Encounter (Signed)
Called and left a voicemail for the patient expressing the message below and advised to call back if there was anything needed further. (okay to leave detailed message on voicemail per DPR).

## 2018-11-08 NOTE — Telephone Encounter (Signed)
FYI Called to inform him that I will send in the Flexeril today. Pt declined Rx stating that it was absolutely insane that we could not get the Rx to him sooner. Pt was very nice about the issue and stated that he is changing doctors as well, I apologized to pt and he stated that it was okay.

## 2018-12-13 ENCOUNTER — Other Ambulatory Visit: Payer: Self-pay | Admitting: Orthopedic Surgery

## 2018-12-14 NOTE — Pre-Procedure Instructions (Signed)
Jason Chan  12/14/2018      [id number] - [place], [place] - [address] [place] [address] Phone: [phone] Fax: [phone]  [facility] - [place], [place] - [address] [facility] [address] [place] [address] [place] [address] Phone: [phone] Fax: [phone]    Your procedure is scheduled on 12-20-18 Wednesday  Report to [facility] at 11 A.M.  Call this number if you have problems the morning of surgery:  [phone]   Remember:  Do not eat  after midnight.  You may drink clear liquids until 11 am day of surgery.  Clear liquids allowed are:  Water, Juice (non-citric and without pulp), Carbonated beverages, Clear Tea, Black Coffee only, Plain Jell-O only, Gatorade and Plain Popsicles only   Please complete your PRE-SURGERY ENSURE that was provided to you by 10 am on the day of surgery.  Please, if able, drink it in one setting. DO NOT SIP.    Take these medicines the morning of surgery with A SIP OF WATER :              Atorvastatin (LIPITOR)   As of today, STOP taking any Aspirin (unless otherwise instructed by your surgeon), Aleve, Naproxen, Ibuprofen, Motrin, Advil, Goody's, BC's, all herbal medications, fish oil, and all vitamins.    Do not wear jewelry, make-up or nail polish.  Do not wear lotions, powders, or perfumes, or deodorant.  Do not shave 48 hours prior to surgery.  Men may shave face and neck.  Do not bring valuables to the hospital.  [facility] is not responsible for any belongings or valuables.  Contacts, dentures or bridgework may not be worn into surgery.  Leave your suitcase in the car.  After surgery it may be brought to your room.  For patients admitted to the hospital, discharge time will be determined by your treatment team.  Patients discharged the day of surgery will not be allowed to drive home.   Special instructions: [facility]- Preparing For Surgery  Before surgery, you can play an important role. Because skin is not sterile, your skin needs to be as free of germs as possible. You can reduce the number of germs on your skin by washing with CHG (chlorahexidine gluconate) Soap before surgery.  CHG is an antiseptic cleaner which kills germs and bonds with the skin to continue killing germs even after washing.    Oral Hygiene is also important to reduce your risk of infection.  Remember - BRUSH YOUR TEETH THE MORNING OF SURGERY WITH YOUR REGULAR TOOTHPASTE  Please do not use if you have an allergy to CHG or antibacterial soaps. If your skin becomes reddened/irritated stop using the CHG.  Do not shave (including legs and underarms) for at least 48 hours prior to first CHG shower. It is OK to shave your face.  Please follow these instructions carefully.   1. Shower the NIGHT BEFORE SURGERY and the MORNING OF SURGERY with CHG.   2. If you chose to wash your hair, wash your hair first as usual with your normal shampoo.  3. After you shampoo, rinse your hair and body thoroughly to remove the shampoo.  4. Use CHG as you would any other liquid soap. You can apply CHG directly to the skin and wash gently with a scrungie or a clean washcloth.   5. Apply the  CHG Soap to your body ONLY FROM THE NECK DOWN.  Do not use on open wounds or open sores. Avoid contact with your eyes, ears, mouth and genitals (private parts). Wash Face and genitals (private parts)  with your normal soap.  6. Wash thoroughly, paying special attention to the area where your surgery will be performed.  7. Thoroughly rinse your body with warm water from the neck down.  8. DO NOT shower/wash with your normal soap after using and rinsing off the CHG Soap.  9. Pat yourself dry with a CLEAN TOWEL.  10. Wear CLEAN PAJAMAS to bed the night before surgery, wear comfortable clothes the morning of surgery  11. Place CLEAN SHEETS on your bed the night of  your first shower and DO NOT SLEEP WITH PETS.   Day of Surgery:  Do not apply any deodorants/lotions.  Please wear clean clothes to the hospital/surgery center.   Remember to brush your teeth WITH YOUR REGULAR TOOTHPASTE.  Please read over the following fact sheets that you were given. Pain Booklet, MRSA Information and Surgical Site Infection Prevention

## 2018-12-15 ENCOUNTER — Other Ambulatory Visit: Payer: Self-pay

## 2018-12-15 ENCOUNTER — Encounter (HOSPITAL_COMMUNITY): Payer: Self-pay

## 2018-12-15 ENCOUNTER — Encounter (HOSPITAL_COMMUNITY)
Admission: RE | Admit: 2018-12-15 | Discharge: 2018-12-15 | Disposition: A | Discharge status: Home / Self Care | Payer: 59 | Source: Ambulatory Visit | Attending: Orthopedic Surgery | Admitting: Orthopedic Surgery

## 2018-12-15 DIAGNOSIS — Z01812 Encounter for preprocedural laboratory examination: Secondary | ICD-10-CM | POA: Insufficient documentation

## 2018-12-15 LAB — CBC WITH DIFFERENTIAL/PLATELET
Abs Immature Granulocytes: 0.06 10*3/uL (ref 0.00–0.07)
Basophils Absolute: 0.1 10*3/uL (ref 0.0–0.1)
Basophils Relative: 1 %
Eosinophils Absolute: 0.2 10*3/uL (ref 0.0–0.5)
Eosinophils Relative: 2 %
HCT: 47.4 % (ref 39.0–52.0)
Hemoglobin: 15.4 g/dL (ref 13.0–17.0)
Immature Granulocytes: 1 %
Lymphocytes Relative: 21 %
Lymphs Abs: 2.1 10*3/uL (ref 0.7–4.0)
MCH: 31.2 pg (ref 26.0–34.0)
MCHC: 32.5 g/dL (ref 30.0–36.0)
MCV: 96.1 fL (ref 80.0–100.0)
Monocytes Absolute: 0.7 10*3/uL (ref 0.1–1.0)
Monocytes Relative: 7 %
Neutro Abs: 7.1 10*3/uL (ref 1.7–7.7)
Neutrophils Relative %: 68 %
Platelets: 292 10*3/uL (ref 150–400)
RBC: 4.93 MIL/uL (ref 4.22–5.81)
RDW: 12.2 % (ref 11.5–15.5)
WBC: 10.2 10*3/uL (ref 4.0–10.5)
nRBC: 0 % (ref 0.0–0.2)

## 2018-12-15 LAB — URINALYSIS, ROUTINE W REFLEX MICROSCOPIC
Bilirubin Urine: NEGATIVE
Glucose, UA: NEGATIVE mg/dL
Hgb urine dipstick: NEGATIVE
Ketones, ur: NEGATIVE mg/dL
Leukocytes,Ua: NEGATIVE
Nitrite: NEGATIVE
Protein, ur: NEGATIVE mg/dL
Specific Gravity, Urine: 1.02 (ref 1.005–1.030)
pH: 5 (ref 5.0–8.0)

## 2018-12-15 LAB — COMPREHENSIVE METABOLIC PANEL
ALT: 22 U/L (ref 0–44)
AST: 21 U/L (ref 15–41)
Albumin: 4.1 g/dL (ref 3.5–5.0)
Alkaline Phosphatase: 79 U/L (ref 38–126)
Anion gap: 10 (ref 5–15)
BUN: 14 mg/dL (ref 6–20)
CO2: 26 mmol/L (ref 22–32)
Calcium: 9.2 mg/dL (ref 8.9–10.3)
Chloride: 104 mmol/L (ref 98–111)
Creatinine, Ser: 1.11 mg/dL (ref 0.61–1.24)
GFR calc Af Amer: >60 mL/min (ref >60)
GFR calc non Af Amer: >60 mL/min (ref >60)
Glucose, Bld: 112 mg/dL — ABNORMAL HIGH (ref 70–99)
Potassium: 4.5 mmol/L (ref 3.5–5.1)
Sodium: 140 mmol/L (ref 135–145)
Total Bilirubin: 0.7 mg/dL (ref 0.3–1.2)
Total Protein: 7.3 g/dL (ref 6.5–8.1)

## 2018-12-15 LAB — ABO/RH: ABO/RH(D): A POS

## 2018-12-15 LAB — TYPE AND SCREEN
ABO/RH(D): A POS
Antibody Screen: NEGATIVE

## 2018-12-15 LAB — PROTIME-INR
INR: 1 (ref 0.8–1.2)
Prothrombin Time: 13 seconds (ref 11.4–15.2)

## 2018-12-15 LAB — SURGICAL PCR SCREEN
MRSA, PCR: NEGATIVE
Staphylococcus aureus: NEGATIVE

## 2018-12-15 LAB — APTT: aPTT: 32 seconds (ref 24–36)

## 2018-12-15 NOTE — Progress Notes (Signed)
PCP - Clinical biochemist - Denies  Chest x-ray - NA EKG - NA  Stress Test - Denies  ECHO - Denies  Cardiac Cath - Denies  AICD-Denies PM-Denies LOOP-Denies  Sleep Study - Denies CPAP - NA  LABS-CBC,CMP,PT,APTT,UA,T/S,PCR  ASA-Denies  HA1C-NA Fasting Blood Sugar -  Checks Blood Sugar ___0__ times a day  Anesthesia-NA  Pt denies having chest pain, sob, or fever at this time. All instructions explained to the pt, with a verbal understanding of the material. Pt agrees to go over the instructions while at home for a better understanding. The opportunity to ask questions was provided.

## 2018-12-15 NOTE — Progress Notes (Signed)

## 2018-12-19 NOTE — Progress Notes (Signed)
Denies fever, cough, shob, COVID-19 exposure, or travel since PAT appointment. 

## 2018-12-20 ENCOUNTER — Other Ambulatory Visit: Payer: Self-pay

## 2018-12-20 ENCOUNTER — Ambulatory Visit (HOSPITAL_COMMUNITY)
Admission: RE | Admit: 2018-12-20 | Discharge: 2018-12-20 | Disposition: A | Discharge status: Home / Self Care | Payer: 59 | Attending: Orthopedic Surgery | Admitting: Orthopedic Surgery

## 2018-12-20 ENCOUNTER — Encounter (HOSPITAL_COMMUNITY)
Admission: RE | Disposition: A | Discharge status: Home / Self Care | Payer: Self-pay | Source: Home / Self Care | Attending: Orthopedic Surgery

## 2018-12-20 ENCOUNTER — Ambulatory Visit (HOSPITAL_COMMUNITY): Payer: 59 | Admitting: Anesthesiology

## 2018-12-20 ENCOUNTER — Ambulatory Visit (HOSPITAL_COMMUNITY): Payer: 59 | Admitting: Physician Assistant

## 2018-12-20 ENCOUNTER — Encounter (HOSPITAL_COMMUNITY): Payer: Self-pay

## 2018-12-20 ENCOUNTER — Ambulatory Visit (HOSPITAL_COMMUNITY): Payer: 59

## 2018-12-20 DIAGNOSIS — Z87891 Personal history of nicotine dependence: Secondary | ICD-10-CM | POA: Diagnosis not present

## 2018-12-20 DIAGNOSIS — Z803 Family history of malignant neoplasm of breast: Secondary | ICD-10-CM | POA: Insufficient documentation

## 2018-12-20 DIAGNOSIS — Z8261 Family history of arthritis: Secondary | ICD-10-CM | POA: Diagnosis not present

## 2018-12-20 DIAGNOSIS — M5117 Intervertebral disc disorders with radiculopathy, lumbosacral region: Secondary | ICD-10-CM | POA: Diagnosis not present

## 2018-12-20 DIAGNOSIS — Z79899 Other long term (current) drug therapy: Secondary | ICD-10-CM | POA: Diagnosis not present

## 2018-12-20 DIAGNOSIS — Z419 Encounter for procedure for purposes other than remedying health state, unspecified: Secondary | ICD-10-CM

## 2018-12-20 DIAGNOSIS — Z809 Family history of malignant neoplasm, unspecified: Secondary | ICD-10-CM | POA: Diagnosis not present

## 2018-12-20 DIAGNOSIS — E785 Hyperlipidemia, unspecified: Secondary | ICD-10-CM | POA: Diagnosis not present

## 2018-12-20 DIAGNOSIS — Z791 Long term (current) use of non-steroidal anti-inflammatories (NSAID): Secondary | ICD-10-CM | POA: Diagnosis not present

## 2018-12-20 DIAGNOSIS — Z8249 Family history of ischemic heart disease and other diseases of the circulatory system: Secondary | ICD-10-CM | POA: Diagnosis not present

## 2018-12-20 HISTORY — PX: LUMBAR LAMINECTOMY/DECOMPRESSION MICRODISCECTOMY: SHX5026

## 2018-12-20 SURGERY — LUMBAR LAMINECTOMY/DECOMPRESSION MICRODISCECTOMY
Anesthesia: General | Laterality: Right

## 2018-12-20 SURGERY — LUMBAR LAMINECTOMY/DECOMPRESSION MICRODISCECTOMY
Anesthesia: General | Site: Back | Laterality: Right

## 2018-12-20 MED ORDER — DIAZEPAM 5 MG PO TABS: 5.0000 mg | ORAL_TABLET | Freq: Three times a day (TID) | ORAL | 0 refills | Status: AC | PRN

## 2018-12-20 MED ORDER — OXYCODONE HCL 5 MG PO TABS: 5.0000 mg | ORAL_TABLET | Freq: Once | ORAL | Status: DC | PRN

## 2018-12-20 MED ORDER — PROPOFOL 10 MG/ML IV BOLUS
INTRAVENOUS | Status: AC
  Filled 2018-12-20: qty 20

## 2018-12-20 MED ORDER — HEMOSTATIC AGENTS (NO CHARGE) OPTIME
TOPICAL | Status: DC | PRN
  Administered 2018-12-20: 1 via TOPICAL

## 2018-12-20 MED ORDER — OXYCODONE HCL 5 MG/5ML PO SOLN: 5.0000 mg | Freq: Once | ORAL | Status: DC | PRN

## 2018-12-20 MED ORDER — ESMOLOL HCL 100 MG/10ML IV SOLN
INTRAVENOUS | Status: DC | PRN
  Administered 2018-12-20: 30 mg via INTRAVENOUS
  Administered 2018-12-20: 40 mg via INTRAVENOUS
  Administered 2018-12-20: 30 mg via INTRAVENOUS

## 2018-12-20 MED ORDER — SUGAMMADEX SODIUM 200 MG/2ML IV SOLN
INTRAVENOUS | Status: DC | PRN
  Administered 2018-12-20: 200 mg via INTRAVENOUS

## 2018-12-20 MED ORDER — OXYCODONE-ACETAMINOPHEN 5-325 MG PO TABS: 1.0000 | ORAL_TABLET | ORAL | 0 refills | Status: AC | PRN

## 2018-12-20 MED ORDER — PROPOFOL 10 MG/ML IV BOLUS
INTRAVENOUS | Status: DC | PRN
  Administered 2018-12-20: 150 mg via INTRAVENOUS
  Administered 2018-12-20: 50 mg via INTRAVENOUS

## 2018-12-20 MED ORDER — METHYLENE BLUE 0.5 % INJ SOLN
INTRAVENOUS | Status: DC | PRN
  Administered 2018-12-20: 5 mL via SUBMUCOSAL

## 2018-12-20 MED ORDER — CEFAZOLIN SODIUM-DEXTROSE 2-4 GM/100ML-% IV SOLN
2.0000 g | INTRAVENOUS | Status: AC
  Administered 2018-12-20: 2 g via INTRAVENOUS
  Filled 2018-12-20: qty 100

## 2018-12-20 MED ORDER — LIDOCAINE 2% (20 MG/ML) 5 ML SYRINGE
INTRAMUSCULAR | Status: DC | PRN
  Administered 2018-12-20 (×2): 50 mg via INTRAVENOUS

## 2018-12-20 MED ORDER — ONDANSETRON HCL 4 MG/2ML IJ SOLN
INTRAMUSCULAR | Status: DC | PRN
  Administered 2018-12-20: 4 mg via INTRAVENOUS

## 2018-12-20 MED ORDER — DEXAMETHASONE SODIUM PHOSPHATE 10 MG/ML IJ SOLN
INTRAMUSCULAR | Status: DC | PRN
  Administered 2018-12-20: 10 mg via INTRAVENOUS

## 2018-12-20 MED ORDER — FENTANYL CITRATE (PF) 100 MCG/2ML IJ SOLN
INTRAMUSCULAR | Status: DC | PRN
  Administered 2018-12-20: 50 ug via INTRAVENOUS
  Administered 2018-12-20 (×6): 25 ug via INTRAVENOUS

## 2018-12-20 MED ORDER — FENTANYL CITRATE (PF) 250 MCG/5ML IJ SOLN
INTRAMUSCULAR | Status: AC
  Filled 2018-12-20: qty 5

## 2018-12-20 MED ORDER — BUPIVACAINE-EPINEPHRINE 0.25% -1:200000 IJ SOLN
INTRAMUSCULAR | Status: AC
  Filled 2018-12-20: qty 1

## 2018-12-20 MED ORDER — DEXAMETHASONE SODIUM PHOSPHATE 10 MG/ML IJ SOLN
INTRAMUSCULAR | Status: AC
  Filled 2018-12-20: qty 1

## 2018-12-20 MED ORDER — ROCURONIUM BROMIDE 50 MG/5ML IV SOSY
PREFILLED_SYRINGE | INTRAVENOUS | Status: AC
  Filled 2018-12-20: qty 5

## 2018-12-20 MED ORDER — ROCURONIUM BROMIDE 10 MG/ML (PF) SYRINGE
PREFILLED_SYRINGE | INTRAVENOUS | Status: DC | PRN
  Administered 2018-12-20: 100 mg via INTRAVENOUS
  Administered 2018-12-20: 20 mg via INTRAVENOUS
  Administered 2018-12-20: 10 mg via INTRAVENOUS

## 2018-12-20 MED ORDER — 0.9 % SODIUM CHLORIDE (POUR BTL) OPTIME
TOPICAL | Status: DC | PRN
  Administered 2018-12-20: 13:00:00 1000 mL

## 2018-12-20 MED ORDER — THROMBIN 20000 UNITS EX KIT
PACK | CUTANEOUS | Status: AC
  Filled 2018-12-20: qty 1

## 2018-12-20 MED ORDER — BUPIVACAINE LIPOSOME 1.3 % IJ SUSP
20.0000 mL | INTRAMUSCULAR | Status: AC
  Administered 2018-12-20: 14:00:00 20 mL
  Filled 2018-12-20: qty 20

## 2018-12-20 MED ORDER — MIDAZOLAM HCL 2 MG/2ML IJ SOLN
INTRAMUSCULAR | Status: AC
  Filled 2018-12-20: qty 2

## 2018-12-20 MED ORDER — LACTATED RINGERS IV SOLN
INTRAVENOUS | Status: DC
  Administered 2018-12-20 (×2): via INTRAVENOUS

## 2018-12-20 MED ORDER — METHYLENE BLUE 0.5 % INJ SOLN
INTRAVENOUS | Status: AC
  Filled 2018-12-20: qty 10

## 2018-12-20 MED ORDER — METHYLPREDNISOLONE ACETATE 40 MG/ML IJ SUSP
INTRAMUSCULAR | Status: DC | PRN
  Administered 2018-12-20: 10 mg

## 2018-12-20 MED ORDER — METHYLPREDNISOLONE ACETATE 80 MG/ML IJ SUSP
INTRAMUSCULAR | Status: AC
  Filled 2018-12-20: qty 1

## 2018-12-20 MED ORDER — FENTANYL CITRATE (PF) 100 MCG/2ML IJ SOLN: 25.0000 ug | INTRAMUSCULAR | Status: DC | PRN

## 2018-12-20 MED ORDER — MIDAZOLAM HCL 5 MG/5ML IJ SOLN
INTRAMUSCULAR | Status: DC | PRN
  Administered 2018-12-20: 2 mg via INTRAVENOUS

## 2018-12-20 MED ORDER — BUPIVACAINE-EPINEPHRINE 0.25% -1:200000 IJ SOLN
INTRAMUSCULAR | Status: DC | PRN
  Administered 2018-12-20: 50 mL

## 2018-12-20 MED ORDER — ONDANSETRON HCL 4 MG/2ML IJ SOLN
INTRAMUSCULAR | Status: AC
  Filled 2018-12-20: qty 2

## 2018-12-20 MED ORDER — THROMBIN 20000 UNITS EX SOLR
CUTANEOUS | Status: DC | PRN
  Administered 2018-12-20: 13:00:00 20 mL via TOPICAL

## 2018-12-20 MED ORDER — POVIDONE-IODINE 7.5 % EX SOLN: Freq: Once | CUTANEOUS | Status: DC

## 2018-12-20 MED ORDER — ONDANSETRON HCL 4 MG/2ML IJ SOLN: 4.0000 mg | Freq: Once | INTRAMUSCULAR | Status: DC | PRN

## 2018-12-20 MED ORDER — LIDOCAINE 2% (20 MG/ML) 5 ML SYRINGE
INTRAMUSCULAR | Status: AC
  Filled 2018-12-20: qty 5

## 2018-12-20 SURGICAL SUPPLY — 73 items
BENZOIN TINCTURE PRP APPL 2/3 (GAUZE/BANDAGES/DRESSINGS) ×3 IMPLANT
BUR PRECISION FLUTE 5.0 (BURR) ×6 IMPLANT
CABLE BIPOLOR RESECTION CORD (MISCELLANEOUS) ×3 IMPLANT
CANISTER SUCT 3000ML PPV (MISCELLANEOUS) ×3 IMPLANT
CARTRIDGE OIL MAESTRO DRILL (MISCELLANEOUS) ×1 IMPLANT
CLOSURE WOUND 1/2 X4 (GAUZE/BANDAGES/DRESSINGS) ×1
CORD BIPOLAR FORCEPS 12FT (ELECTRODE) ×3 IMPLANT
COVER SURGICAL LIGHT HANDLE (MISCELLANEOUS) ×3 IMPLANT
COVER WAND RF STERILE (DRAPES) ×3 IMPLANT
DIFFUSER DRILL AIR PNEUMATIC (MISCELLANEOUS) ×3 IMPLANT
DRAIN CHANNEL 15F RND FF W/TCR (WOUND CARE) IMPLANT
DRAPE POUCH INSTRU U-SHP 10X18 (DRAPES) ×6 IMPLANT
DRAPE SURG 17X23 STRL (DRAPES) ×12 IMPLANT
DURAPREP 26ML APPLICATOR (WOUND CARE) ×3 IMPLANT
ELECT BLADE 4.0 EZ CLEAN MEGAD (MISCELLANEOUS) ×3
ELECT CAUTERY BLADE 6.4 (BLADE) ×3 IMPLANT
ELECT REM PT RETURN 9FT ADLT (ELECTROSURGICAL) ×3
ELECTRODE BLDE 4.0 EZ CLN MEGD (MISCELLANEOUS) ×1 IMPLANT
ELECTRODE REM PT RTRN 9FT ADLT (ELECTROSURGICAL) ×1 IMPLANT
EVACUATOR SILICONE 100CC (DRAIN) IMPLANT
FILTER STRAW FLUID ASPIR (MISCELLANEOUS) ×3 IMPLANT
GAUZE 4X4 16PLY RFD (DISPOSABLE) ×6 IMPLANT
GAUZE SPONGE 4X4 12PLY STRL (GAUZE/BANDAGES/DRESSINGS) ×3 IMPLANT
GAUZE SPONGE 4X4 12PLY STRL LF (GAUZE/BANDAGES/DRESSINGS) ×3 IMPLANT
GLOVE BIO SURGEON STRL SZ7 (GLOVE) ×6 IMPLANT
GLOVE BIO SURGEON STRL SZ8 (GLOVE) ×3 IMPLANT
GLOVE BIOGEL PI IND STRL 7.0 (GLOVE) ×1 IMPLANT
GLOVE BIOGEL PI IND STRL 8 (GLOVE) ×1 IMPLANT
GLOVE BIOGEL PI INDICATOR 7.0 (GLOVE) ×2
GLOVE BIOGEL PI INDICATOR 8 (GLOVE) ×2
GOWN STRL REUS W/ TWL LRG LVL3 (GOWN DISPOSABLE) ×1 IMPLANT
GOWN STRL REUS W/ TWL XL LVL3 (GOWN DISPOSABLE) ×2 IMPLANT
GOWN STRL REUS W/TWL LRG LVL3 (GOWN DISPOSABLE) ×2
GOWN STRL REUS W/TWL XL LVL3 (GOWN DISPOSABLE) ×4
IV CATH 14GX2 1/4 (CATHETERS) ×3 IMPLANT
KIT BASIN OR (CUSTOM PROCEDURE TRAY) ×3 IMPLANT
KIT POSITION SURG JACKSON T1 (MISCELLANEOUS) ×3 IMPLANT
KIT TURNOVER KIT B (KITS) ×3 IMPLANT
NEEDLE 18GX1X1/2 (RX/OR ONLY) (NEEDLE) ×3 IMPLANT
NEEDLE 22X1 1/2 (OR ONLY) (NEEDLE) ×3 IMPLANT
NEEDLE HYPO 25GX1X1/2 BEV (NEEDLE) ×3 IMPLANT
NEEDLE SPNL 18GX3.5 QUINCKE PK (NEEDLE) ×6 IMPLANT
NS IRRIG 1000ML POUR BTL (IV SOLUTION) ×3 IMPLANT
OIL CARTRIDGE MAESTRO DRILL (MISCELLANEOUS) ×3
PACK LAMINECTOMY ORTHO (CUSTOM PROCEDURE TRAY) ×3 IMPLANT
PACK UNIVERSAL I (CUSTOM PROCEDURE TRAY) ×3 IMPLANT
PAD ARMBOARD 7.5X6 YLW CONV (MISCELLANEOUS) ×6 IMPLANT
PATTIES SURGICAL .5 X.5 (GAUZE/BANDAGES/DRESSINGS) IMPLANT
PATTIES SURGICAL .5 X1 (DISPOSABLE) ×3 IMPLANT
SPONGE INTESTINAL PEANUT (DISPOSABLE) ×3 IMPLANT
SPONGE SURGIFOAM ABS GEL 100 (HEMOSTASIS) ×3 IMPLANT
SPONGE SURGIFOAM ABS GEL SZ50 (HEMOSTASIS) ×3 IMPLANT
STRIP CLOSURE SKIN 1/2X4 (GAUZE/BANDAGES/DRESSINGS) ×2 IMPLANT
SURGIFLO W/THROMBIN 8M KIT (HEMOSTASIS) ×3 IMPLANT
SUT MNCRL AB 4-0 PS2 18 (SUTURE) ×3 IMPLANT
SUT VIC AB 0 CT1 18XCR BRD 8 (SUTURE) IMPLANT
SUT VIC AB 0 CT1 27 (SUTURE)
SUT VIC AB 0 CT1 27XBRD ANBCTR (SUTURE) IMPLANT
SUT VIC AB 0 CT1 8-18 (SUTURE)
SUT VIC AB 1 CT1 18XCR BRD 8 (SUTURE) ×1 IMPLANT
SUT VIC AB 1 CT1 8-18 (SUTURE) ×2
SUT VIC AB 2-0 CT2 18 VCP726D (SUTURE) ×3 IMPLANT
SYR 20CC LL (SYRINGE) ×3 IMPLANT
SYR BULB IRRIGATION 50ML (SYRINGE) ×3 IMPLANT
SYR CONTROL 10ML LL (SYRINGE) ×6 IMPLANT
SYR TB 1ML 26GX3/8 SAFETY (SYRINGE) ×6 IMPLANT
SYR TB 1ML LUER SLIP (SYRINGE) ×6 IMPLANT
SYRINGE 20CC LL (MISCELLANEOUS) ×6 IMPLANT
TAPE CLOTH SURG 6X10 WHT LF (GAUZE/BANDAGES/DRESSINGS) ×3 IMPLANT
TOWEL GREEN STERILE (TOWEL DISPOSABLE) ×3 IMPLANT
TOWEL GREEN STERILE FF (TOWEL DISPOSABLE) ×3 IMPLANT
WATER STERILE IRR 1000ML POUR (IV SOLUTION) ×3 IMPLANT
YANKAUER SUCT BULB TIP NO VENT (SUCTIONS) ×3 IMPLANT

## 2018-12-20 NOTE — Op Note (Signed)
PATIENT NAME: Jason Chan   MEDICAL RECORD NO.:   [id number]    PHYSICIAN:  Estill BambergMark Mishti Swanton, MD      DATE OF BIRTH: 1975-09-30   DATE OF PROCEDURE: 12/20/2018                              OPERATIVE REPORT     PREOPERATIVE DIAGNOSES: 1. Right-sided S1 radiculopathy. 2. Large right-sided L5-S1 disk herniation causing severe     compression of the right S1 nerve.   POSTOPERATIVE DIAGNOSES: 1. Right-sided S1 radiculopathy. 2. Large right-sided L5-S1 disk herniation causing severe     compression of the right S1 nerve.   PROCEDURES:  Right-sided L5-S1 laminotomy with partial facetectomy and removal of large herniated right-sided L5-S1 disk fragment.   SURGEON:  Estill BambergMark James Senn, MD.   ASSISTANJason Coop:  Jason McKenzie, PA-C.   ANESTHESIA:  General endotracheal anesthesia.   COMPLICATIONS:  None.   DISPOSITION:  Stable.   ESTIMATED BLOOD LOSS:  Minimal.   INDICATIONS FOR SURGERY:  Briefly, Jason Chan is a pleasant 43 year old male, who did present to me with severe pain and weakness in the right leg.  The patient's MRI did reveal the findings outlined above, clearly notable for a large herniated disk fragment. The pain was rather severe and he did have weakness in his leg as well.  We did discuss treatment options and we did ultimately elect to proceed with the procedure reflected above.  The patient was fully made aware of the risks of surgery, including the risk of recurrent herniation and the need for subsequent surgery, including the possibility of a subsequent diskectomy and/or fusion.   OPERATIVE DETAILS:  On 12/20/2018, the patient was brought to surgery and general endotracheal anesthesia was administered.  The patient was placed prone on a well-padded flat Jackson bed with a spinal frame.  Antibiotics were given.  The back was prepped and draped and a time-out procedure was performed.  At this point, a midline incision was made directly over the L5-S1 intervertebral space.  A  curvilinear incision was made just to the right of the midline into the fascia.  A self-retaining McCulloch retractor was placed.  The lamina of L5 and S1 was identified and subperiosteally exposed.  I then removed the lateral aspect of the L5-S1 ligamentum flavum.  Readily identified was the traversing right S1 nerve, which was noted to be under obvious tension, and was noted to be rather erythematous.  I was able to gently gain medial retraction of the nerve, and in doing so, a very large herniated disk fragment was readily noted.  This was removed in two very large fragments. I then additionally explored the lateral recess, and was very pleased with the final decompression that I was able to accomplish.  The nerve was clearly free and mobile.  At this point, the wound was copiously irrigated with normal saline.  All epidural bleeding was controlled using bipolar electrocautery in addition to Surgiflo. All bleeding was controlled at the termination of the procedure.  At this point, 20 mg of Depo-Medrol was introduced about the epidural space in the region of the right S1 nerve.  The wound was then closed in layers using #1 Vicryl followed by 0 Vicryl, followed by 4-0 Monocryl. Benzoin and Steri-Strips were applied followed by a sterile dressing. All instrument counts were correct at the termination of the procedure.   Of note, Jason Chan was my assistant throughout surgery, and  did aid in retraction, suctioning, and closure from start to finish.       Estill Bamberg, MD

## 2018-12-20 NOTE — Anesthesia Procedure Notes (Signed)
Procedure Name: Intubation Performed by: Diaz Crago H, CRNA Pre-anesthesia Checklist: Patient identified, Emergency Drugs available, Suction available and Patient being monitored Patient Re-evaluated:Patient Re-evaluated prior to induction Oxygen Delivery Method: Circle System Utilized Preoxygenation: Pre-oxygenation with 100% oxygen Induction Type: IV induction and Rapid sequence Laryngoscope Size: Mac and 4 Grade View: Grade I Tube type: Oral Tube size: 7.5 mm Number of attempts: 1 Airway Equipment and Method: Stylet and Oral airway Placement Confirmation: ETT inserted through vocal cords under direct vision,  positive ETCO2 and breath sounds checked- equal and bilateral Secured at: 23 cm Tube secured with: Tape Dental Injury: Teeth and Oropharynx as per pre-operative assessment        

## 2018-12-20 NOTE — H&P (Signed)
PREOPERATIVE H&P  Chief Complaint: Right leg pain and weakness  HPI: Jason Chan is a 43 y.o. male who presents with ongoing pain in the right leg.  This did progress to substantial weakness.  An MRI did reveal a large right-sided L5-S1 disc herniation.  The patient's function has been substantially diminished over the course of the last few weeks.  He does feel his leg giving out on him regularly.  MRI reveals a large right L5-S1 disc herniation  Patient has failed multiple forms of conservative care and continues to have pain (see office notes for additional details regarding the patient's full course of treatment)  Past Medical History:  Diagnosis Date  . Allergic rhinitis    otc claritin  . Hyperlipidemia    Past Surgical History:  Procedure Laterality Date  . APPENDECTOMY     around 1990   Social History   Socioeconomic History  . Marital status: Married    Spouse name: Not on file  . Number of children: Not on file  . Years of education: Not on file  . Highest education level: Not on file  Occupational History  . Not on file  Social Needs  . Financial resource strain: Not on file  . Food insecurity:    Worry: Not on file    Inability: Not on file  . Transportation needs:    Medical: Not on file    Non-medical: Not on file  Tobacco Use  . Smoking status: Former Smoker    Packs/day: 0.70    Years: 16.00    Pack years: 11.20  . Smokeless tobacco: Former Neurosurgeon    Types: Snuff  . Tobacco comment: uses vaprorizer with nicotine intermittently  Substance and Sexual Activity  . Alcohol use: Yes    Alcohol/week: 5.0 standard drinks    Types: 5 Standard drinks or equivalent per week  . Drug use: No  . Sexual activity: Yes  Lifestyle  . Physical activity:    Days per week: Not on file    Minutes per session: Not on file  . Stress: Not on file  Relationships  . Social connections:    Talks on phone: Not on file    Gets together: Not on file    Attends  religious service: Not on file    Active member of club or organization: Not on file    Attends meetings of clubs or organizations: Not on file    Relationship status: Not on file  Other Topics Concern  . Not on file  Social History Narrative   Married. 3 kids. (5-10 in 2017)      Claims adjustor      Hobbies: golf, time with kids, xbox   Family History  Problem Relation Age of Onset  . Arthritis Mother   . Breast cancer Mother   . Cancer Father        age 48 ? kind- checking if prostate cancer  . Hyperlipidemia Father   . Hypertension Father   . Heart attack Father        age 45   No Known Allergies Prior to Admission medications   Medication Sig Start Date End Date Taking? Authorizing Provider  atorvastatin (LIPITOR) 20 MG tablet Take 1 tablet (20 mg total) by mouth daily. 06/07/18  Yes Shelva Majestic, MD  ibuprofen (ADVIL,MOTRIN) 200 MG tablet Take 800 mg by mouth 2 (two) times daily as needed for headache or moderate pain.   Yes [provider]     All other systems have been reviewed and were otherwise negative with the exception of those mentioned in the HPI and as above.  Physical Exam: There were no vitals filed for this visit.  There is no height or weight on file to calculate BMI.  General: Alert, no acute distress Cardiovascular: No pedal edema Respiratory: No cyanosis, no use of accessory musculature Skin: No lesions in the area of chief complaint Neurologic: Plantarflexion weakness noted on the right at 4 out of 5 Psychiatric: Patient is competent for consent with normal mood and affect Lymphatic: No axillary or cervical lymphadenopathy  MUSCULOSKELETAL: + SLR on the right  Assessment/Plan: RIGHT S1 RADICULOPATHY, LARGE RIGHT L5/S1 HNP Plan for Procedure(s): RIGHT LUMBAR 5 - SACRUM 1 MICRODISECTOMY  *Of note, the patient's pain has been debilitating, and his weakness has been significant.  He has been having substantial difficulty  performing routine activities of daily living.  I do feel that delaying his surgery for more than an additional month would result in substantial undue stress and potentially progressive neurologic deficit, which could be permanent.  For these reasons, the patient myself did elect to proceed with surgery on an urgent basis.   Jackelyn HoehnMark L Takahiro Godinho, MD 12/20/2018 8:10 AM

## 2018-12-20 NOTE — Transfer of Care (Signed)
Immediate Anesthesia Transfer of Care Note  Patient: Jason Chan  Procedure(s) Performed: RIGHT LUMBAR five - SACRUM one MICRODISECTOMY (Right Back)  Patient Location: PACU  Anesthesia Type:General  Level of Consciousness: awake  Airway & Oxygen Therapy: Patient Spontanous Breathing and Patient connected to face mask oxygen  Post-op Assessment: Report given to RN and Post -op Vital signs reviewed and stable  Post vital signs: Reviewed and stable  Last Vitals:  Vitals Value Taken Time  BP 114/66 12/20/2018  2:28 PM  Temp 36.4 C 12/20/2018  2:28 PM  Pulse 73 12/20/2018  2:32 PM  Resp 14 12/20/2018  2:32 PM  SpO2 100 % 12/20/2018  2:32 PM  Vitals shown include unvalidated device data.  Last Pain:  Vitals:   12/20/18 1428  TempSrc:   PainSc: 0-No pain         Complications: No apparent anesthesia complications

## 2018-12-20 NOTE — Anesthesia Preprocedure Evaluation (Signed)
Anesthesia Evaluation  Patient identified by MRN, date of birth, ID band Patient awake    Reviewed: Allergy & Precautions, NPO status , Patient's Chart, lab work & pertinent test results  Airway Mallampati: II  TM Distance: >3 FB Neck ROM: Full    Dental  (+) Teeth Intact, Dental Advisory Given   Pulmonary former smoker,    breath sounds clear to auscultation       Cardiovascular  Rhythm:Regular Rate:Normal     Neuro/Psych    GI/Hepatic   Endo/Other    Renal/GU      Musculoskeletal   Abdominal   Peds  Hematology   Anesthesia Other Findings   Reproductive/Obstetrics                             Anesthesia Physical Anesthesia Plan  ASA: I  Anesthesia Plan: General   Post-op Pain Management:    Induction: Intravenous  PONV Risk Score and Plan: Ondansetron and Dexamethasone  Airway Management Planned: Oral ETT  Additional Equipment:   Intra-op Plan:   Post-operative Plan: Extubation in OR  Informed Consent: I have reviewed the patients History and Physical, chart, labs and discussed the procedure including the risks, benefits and alternatives for the proposed anesthesia with the patient or authorized representative who has indicated his/her understanding and acceptance.     Dental advisory given  Plan Discussed with: CRNA and Anesthesiologist  Anesthesia Plan Comments:         Anesthesia Quick Evaluation  

## 2018-12-21 ENCOUNTER — Encounter (HOSPITAL_COMMUNITY): Payer: Self-pay | Admitting: Orthopedic Surgery

## 2018-12-21 NOTE — Anesthesia Postprocedure Evaluation (Signed)
Anesthesia Post Note  Patient: Jason Chan  Procedure(s) Performed: RIGHT LUMBAR five - SACRUM one MICRODISECTOMY (Right Back)     Patient location during evaluation: PACU Anesthesia Type: General Level of consciousness: awake and alert Pain management: pain level controlled Vital Signs Assessment: post-procedure vital signs reviewed and stable Respiratory status: spontaneous breathing, nonlabored ventilation, respiratory function stable and patient connected to nasal cannula oxygen Cardiovascular status: blood pressure returned to baseline and stable Postop Assessment: no apparent nausea or vomiting Anesthetic complications: no    Last Vitals:  Vitals:   12/20/18 1510 12/20/18 1525  BP: 114/67 105/62  Pulse: 64 (!) 56  Resp: 14 15  Temp:  (!) 36.3 C  SpO2: 100% 100%    Last Pain:  Vitals:   12/20/18 1525  TempSrc:   PainSc: 0-No pain                 Kennis Wissmann COKER

## 2019-04-15 ENCOUNTER — Other Ambulatory Visit: Payer: Self-pay | Admitting: Family Medicine

## 2019-05-01 ENCOUNTER — Other Ambulatory Visit: Payer: Self-pay | Admitting: Family Medicine

## 2020-02-29 IMAGING — CR LUMBAR SPINE - 2-3 VIEW
2 series · 2 of 2 positions shown · non-contrast
Comparison: None.

CLINICAL DATA: L5-S1 diskectomy.

EXAM:
LUMBAR SPINE - 2-3 VIEW

[lateral (1 of 2)]
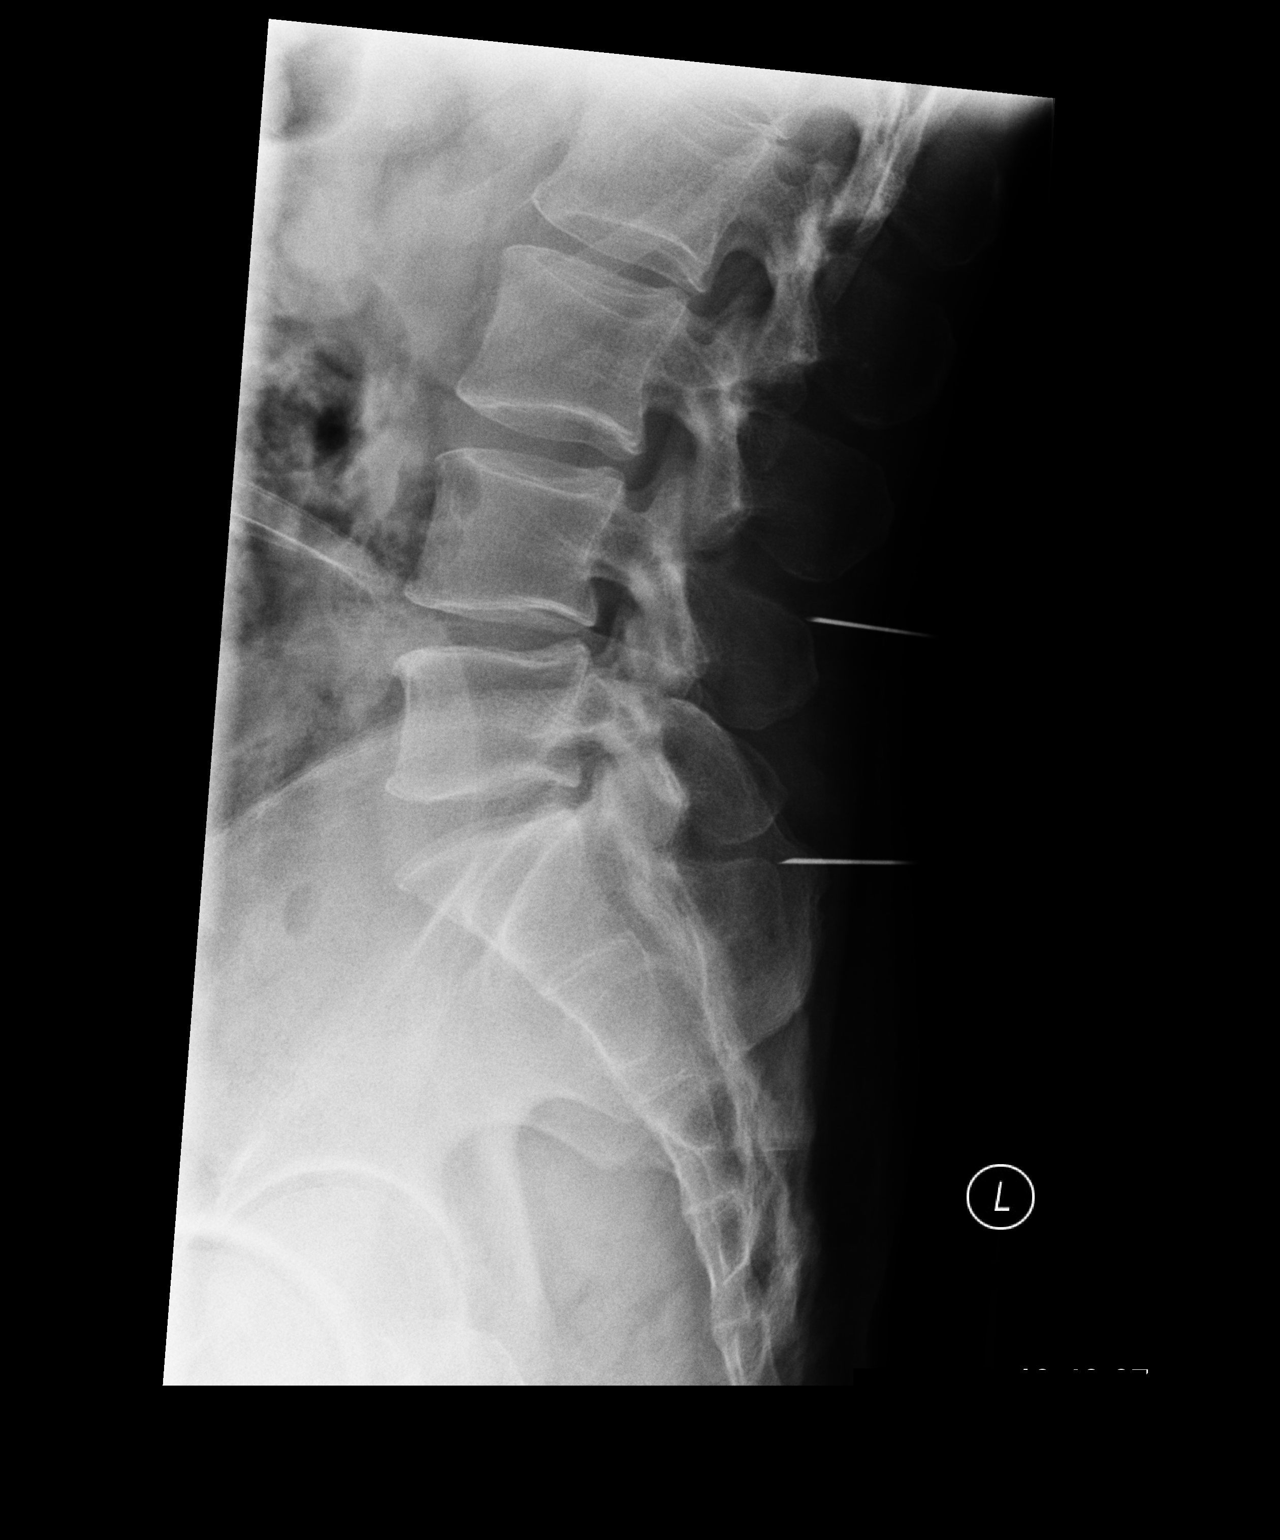

[lateral (2 of 2)]
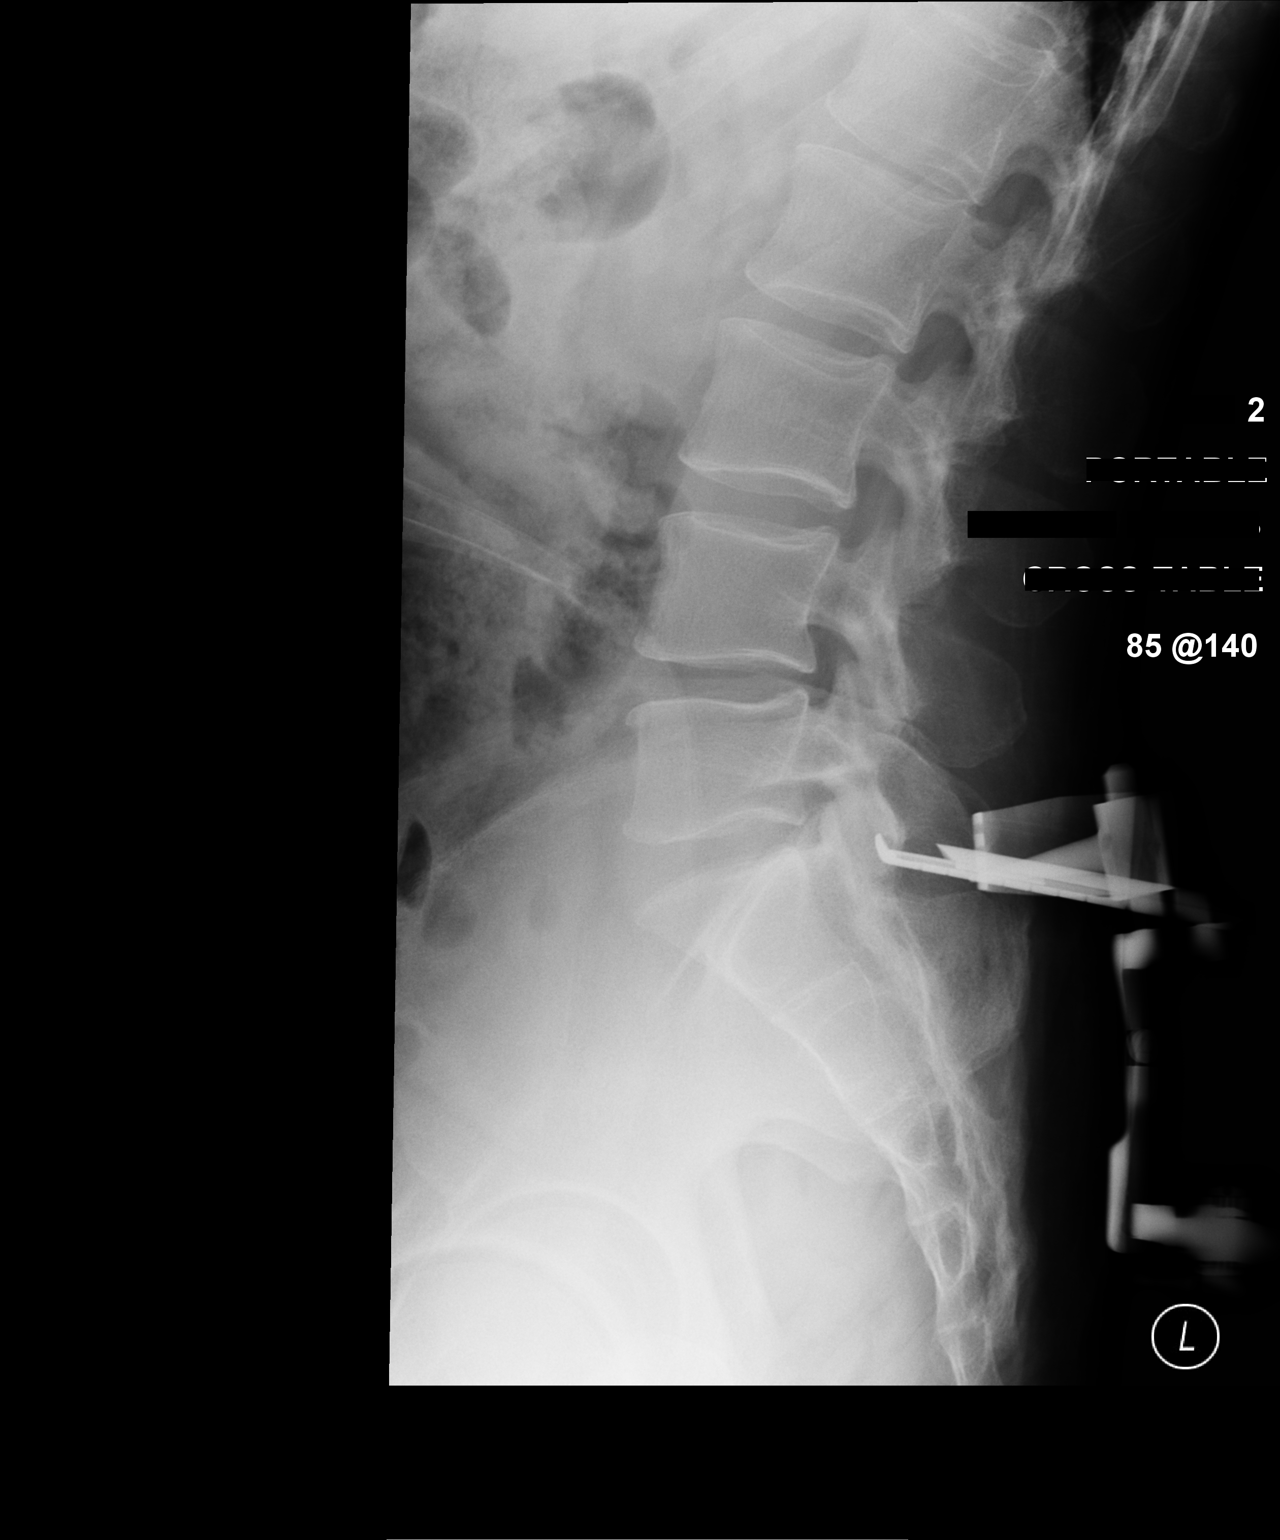

[2 of 2 positions shown; findings below may reference images not displayed]

FINDINGS: Surgical markers are seen posteriorly at the inferior L4 and
superior S1 levels on the initial image. A surgical instrument is
seen posteriorly at the L5-S1 level on the second image.
IMPRESSION: Surgical marker/instrument placements as above.

## 2024-03-20 ENCOUNTER — Encounter (INDEPENDENT_AMBULATORY_CARE_PROVIDER_SITE_OTHER): Payer: Self-pay | Admitting: Otolaryngology

## 2024-03-20 ENCOUNTER — Ambulatory Visit (INDEPENDENT_AMBULATORY_CARE_PROVIDER_SITE_OTHER): Admitting: Otolaryngology

## 2024-03-20 VITALS — BP 125/81 | HR 63 | Wt 210.0 lb

## 2024-03-20 DIAGNOSIS — J3489 Other specified disorders of nose and nasal sinuses: Secondary | ICD-10-CM | POA: Diagnosis not present

## 2024-03-20 DIAGNOSIS — J343 Hypertrophy of nasal turbinates: Secondary | ICD-10-CM | POA: Diagnosis not present

## 2024-03-20 DIAGNOSIS — J342 Deviated nasal septum: Secondary | ICD-10-CM | POA: Diagnosis not present

## 2024-03-20 DIAGNOSIS — R0981 Nasal congestion: Secondary | ICD-10-CM

## 2024-03-20 NOTE — Addendum Note (Signed)
 Addended by: Elodia Haviland on: 03/20/2024 09:19 AM   Modules accepted: Orders

## 2024-03-20 NOTE — Progress Notes (Signed)
 Dear Dr. Harl, Here is my assessment for our mutual patient, Jason Chan. Thank you for allowing me the opportunity to care for your patient. Please do not hesitate to contact me should you have any other questions. Sincerely, Dr. Eldora Blanch  Otolaryngology Clinic Note  HISTORY: Jason Chan is a 48 y.o. male kindly referred by Dr. Harl for evaluation of OSA with poor CPAP fit and nasal septal deviation and congestion  Initial visit (03/2024): He reports can't tell you last time I've been able to breathe out of both nostrils - he has been using flonase for past few months, helps but not significant enough. Worsening. Left worse than right. Issues with CPAP tolerance - does not use them. Using oral appliance.  He denies CRS symptoms. Sinus infections rarely - 1 over past 5 years. Did have a softball injury and did have epistaxis afterwards - no diagnosed NB fracture. Breathe right strips ineffective He has tried flonase, PO anthistamine. Neti pot tried - won't go through.  Denies typical AR symptoms currently but was on AIT as a child - he recalls cats and grasses as allergies. No previous sinonasal surgery.  GLP-1: no AP/AC: no  Tobacco: prior, quit  PMHx: OSA, Back Pain  RADIOGRAPHIC EVALUATION AND INDEPENDENT REVIEW OF OTHER RECORDS:: Dr. Harl Referral notes reviewed and uploaded or available in chart in media tab (11/22/2023): noted OSA and improved with PAP but poor mask fit. Significant nasal congestion; Dx: Nasal congestion, OSA; Rx: ref to ENT Sleep study (04/13/2023): AHI 23.5; <88%: 8 minutes. CBC and CMP 10/13/2023: WBC 4.5, Eos 100; BUN/Cr 17/1  Past Medical History:  Diagnosis Date   Allergic rhinitis    otc claritin   Hyperlipidemia    Past Surgical History:  Procedure Laterality Date   APPENDECTOMY     around 10   LUMBAR LAMINECTOMY/DECOMPRESSION MICRODISCECTOMY Right 12/20/2018   Procedure: RIGHT LUMBAR five - SACRUM one MICRODISECTOMY;  Surgeon:  Beuford Anes, MD;  Location: [facility] OR;  Service: Orthopedics;  Laterality: Right;   Family History  Problem Relation Age of Onset   Arthritis Mother    Breast cancer Mother    Cancer Father        age 85 ? kind- checking if prostate cancer   Hyperlipidemia Father    Hypertension Father    Heart attack Father        age 39   Social History   Tobacco Use   Smoking status: Former    Current packs/day: 0.70    Average packs/day: 0.7 packs/day for 16.0 years (11.2 ttl pk-yrs)    Types: Cigarettes   Smokeless tobacco: Former    Types: Snuff   Tobacco comments:    uses vaprorizer with nicotine intermittently  Substance Use Topics   Alcohol use: Yes    Alcohol/week: 5.0 standard drinks of alcohol    Types: 5 Standard drinks or equivalent per week   No Known Allergies Current Outpatient Medications  Medication Sig Dispense Refill   atorvastatin  (LIPITOR) 20 MG tablet Take 1 tablet (20 mg total) by mouth daily. 90 tablet 3   No current facility-administered medications for this visit.   BP 125/81   Pulse 63   Wt 210 lb (95.3 kg)   SpO2 97%   BMI 29.29 kg/m   PHYSICAL EXAM:  BP 125/81   Pulse 63   Wt 210 lb (95.3 kg)   SpO2 97%   BMI 29.29 kg/m    Salient findings:  CN II-XII intact Bilateral EAC  clear and TM intact with well pneumatized middle ear spaces Nose: Anterior rhinoscopy reveals septal deviation left, bilateral inferior turbinate hypertrophy; modified cottle very weak positive b/l.  Nasal endoscopy was indicated to better evaluate the nose and paranasal sinuses, given the patient's history and exam findings, and is detailed below. No lesions of oral cavity/oropharynx No obviously palpable neck masses/lymphadenopathy/thyromegaly No respiratory distress or stridor   PROCEDURE:  Prior to initiating any procedures, risks/benefits/alternatives were explained to the patient and verbal consent obtained. Diagnostic Nasal Endoscopy Pre-procedure diagnosis: Concern  for nasal obstruction, nasal congestion Post-procedure diagnosis: same Indication: See pre-procedure diagnosis and physical exam above Complications: None apparent EBL: 0 mL Anesthesia: Lidocaine  4% and topical decongestant was topically sprayed in each nasal cavity  Description of Procedure:  Patient was identified. A rigid 30 degree endoscope was utilized to evaluate the sinonasal cavities, mucosa, sinus ostia and turbinates and septum.  Overall, signs of mucosal inflammation are not noted.  Also noted are left septal deviation, bilateral inferior turbinate hypertrophy.  No mucopurulence, polyps, or masses noted.   Right Middle meatus: clear Right SE Recess: clear Left MM: clear Left SE Recess: clear    Photodocumentation was obtained.  CPT CODE -- 31231 - Mod 25   ASSESSMENT:  48 y.o. with:  1. Nasal congestion   2. Hypertrophy of both inferior nasal turbinates   3. Nasal obstruction   4. Nasal septal deviation    PLAN: We've discussed issues and options today.  We reviewed the nasal endoscopy images together.  The risks, benefits and alternatives were discussed and questions answered.  He has elected to proceed with:  1) Flonase BID - continue 2) Nasal saline spray BID - We discussed the goals of septoplasty and turbinate reduction, and expectations for postoperative management. Will plan to leave splints in place, and removal was also discussed. We also discussed nasal obstruction post-operatively until splints in place and pain management.  We discussed R/B/A including pain, infection, bleeding (~5% risk of operative visit for control), persistent symptoms, need for revision surgery, and other risks including damage to surrounding structures, septal perforation, anesthetic complications, among others.  We discussed use of nasal saline spray and nasal saline irrigations post-operatively We also discussed use of intranasal steroid post-operatively until healing  occurs Patient understands and is ready to proceed.  Schedule for septo/turbs F/u POD 5  See below regarding exact medications prescribed this encounter including dosages and route: No orders of the defined types were placed in this encounter.    Thank you for allowing me the opportunity to care for your patient. Please do not hesitate to contact me should you have any other questions.  Sincerely, Eldora Blanch, MD Otolaryngologist (ENT), [facility] Phone: [phone] Fax: [phone]  MDM:  Level 4: [phone] Complexity/Problems addressed: mod - chronic problems, exacerbation Data complexity: mod - independent review of notes, labs, test - Morbidity: mod - decision for surgery  - Prescription Drug prescribed or managed: no  03/20/2024, 8:31 AM

## 2024-05-25 ENCOUNTER — Telehealth (INDEPENDENT_AMBULATORY_CARE_PROVIDER_SITE_OTHER): Payer: Self-pay | Admitting: Otolaryngology

## 2024-05-25 DIAGNOSIS — J343 Hypertrophy of nasal turbinates: Secondary | ICD-10-CM | POA: Diagnosis not present

## 2024-05-25 DIAGNOSIS — R0981 Nasal congestion: Secondary | ICD-10-CM | POA: Diagnosis not present

## 2024-05-25 DIAGNOSIS — J342 Deviated nasal septum: Secondary | ICD-10-CM | POA: Diagnosis not present

## 2024-05-25 DIAGNOSIS — J3489 Other specified disorders of nose and nasal sinuses: Secondary | ICD-10-CM | POA: Diagnosis not present

## 2024-05-25 MED ORDER — OXYMETAZOLINE HCL 0.05 % NA SOLN: 2.0000 | Freq: Three times a day (TID) | NASAL | 0 refills | Status: AC | PRN

## 2024-05-25 MED ORDER — ACETAMINOPHEN 500 MG PO TABS: 1000.0000 mg | ORAL_TABLET | Freq: Four times a day (QID) | ORAL | 0 refills | Status: AC | PRN

## 2024-05-25 MED ORDER — IBUPROFEN 400 MG PO TABS: 400.0000 mg | ORAL_TABLET | Freq: Four times a day (QID) | ORAL | 0 refills | Status: AC | PRN

## 2024-05-25 MED ORDER — OXYCODONE HCL 5 MG PO CAPS: 5.0000 mg | ORAL_CAPSULE | ORAL | 0 refills | Status: AC | PRN

## 2024-05-25 MED ORDER — SALINE SPRAY 0.65 % NA SOLN: 2.0000 | NASAL | 0 refills | Status: AC | PRN

## 2024-05-25 NOTE — Telephone Encounter (Signed)
 ENT Note: Septo/Turbs performed at [facility] 05/25/2024. Will Rx tylenol , ibuprofen , and oxycodone  5mg  tabs #15 q6h prn. F/u scheduled  Avondre Richens B Kerisha Goughnour

## 2024-05-30 ENCOUNTER — Encounter (INDEPENDENT_AMBULATORY_CARE_PROVIDER_SITE_OTHER): Payer: Self-pay | Admitting: Otolaryngology

## 2024-05-30 ENCOUNTER — Ambulatory Visit (INDEPENDENT_AMBULATORY_CARE_PROVIDER_SITE_OTHER): Admitting: Otolaryngology

## 2024-05-30 VITALS — BP 126/86 | HR 79 | Ht 71.0 in | Wt 210.0 lb

## 2024-05-30 DIAGNOSIS — J342 Deviated nasal septum: Secondary | ICD-10-CM

## 2024-05-30 DIAGNOSIS — Z9889 Other specified postprocedural states: Secondary | ICD-10-CM

## 2024-05-30 DIAGNOSIS — R0981 Nasal congestion: Secondary | ICD-10-CM

## 2024-05-30 DIAGNOSIS — J343 Hypertrophy of nasal turbinates: Secondary | ICD-10-CM

## 2024-05-30 DIAGNOSIS — J3489 Other specified disorders of nose and nasal sinuses: Secondary | ICD-10-CM

## 2024-05-30 NOTE — Progress Notes (Signed)
 S/p Setoplasty/turbinate reduction on  05/25/2024 S: Doing well, nasal breathing is much improved after split removal. No pain, no fevers, no bleeding.  O: Doyle splints in place, removed; after removal, no septal hematoma noted; Able to visualize axilla of MT bilaterally; tubinates reduced; no epistaxis. No evidence of infection A/P: 48 y.o. M w/ Septal deviation and b/l ITH s/p septoplasty and b/l ITH. Doing well Will do BID nasal rinses; continue BID Flonase and saline spray F/u 1 month   Eldora KATHEE Blanch

## 2024-07-04 ENCOUNTER — Encounter (INDEPENDENT_AMBULATORY_CARE_PROVIDER_SITE_OTHER): Payer: Self-pay | Admitting: Otolaryngology

## 2024-07-04 ENCOUNTER — Ambulatory Visit (INDEPENDENT_AMBULATORY_CARE_PROVIDER_SITE_OTHER): Admitting: Otolaryngology

## 2024-07-04 VITALS — BP 129/73 | HR 87 | Ht 71.0 in | Wt 210.0 lb

## 2024-07-04 DIAGNOSIS — J342 Deviated nasal septum: Secondary | ICD-10-CM

## 2024-07-04 DIAGNOSIS — J343 Hypertrophy of nasal turbinates: Secondary | ICD-10-CM

## 2024-07-04 DIAGNOSIS — J3489 Other specified disorders of nose and nasal sinuses: Secondary | ICD-10-CM

## 2024-07-04 DIAGNOSIS — R0981 Nasal congestion: Secondary | ICD-10-CM

## 2024-07-04 NOTE — Progress Notes (Signed)
 S/p Setoplasty/turbinate reduction on  05/25/2024 S: Doing well, nasal breathing is much improved after split removal. He is extremely happy O: no septal hematoma noted; Able to visualize axilla of MT bilaterally; tubinates reduced; no epistaxis. No evidence of infection A/P: 48 y.o. M w/ Septal deviation and b/l ITH s/p septoplasty and b/l ITH. Doing well F/u PRN; can use saline spray or flonase if needs to   Eldora KATHEE Blanch
